# Patient Record
Sex: Female | Born: 1970 | Race: White | Hispanic: No | Smoking: Former smoker
Health system: Southern US, Community
[De-identification: ages and names within clinical notes are randomized; demographics above are authoritative.]

## PROBLEM LIST (undated history)

## (undated) DIAGNOSIS — F419 Anxiety disorder, unspecified: Secondary | ICD-10-CM

## (undated) DIAGNOSIS — N200 Calculus of kidney: Secondary | ICD-10-CM

## (undated) DIAGNOSIS — N19 Unspecified kidney failure: Secondary | ICD-10-CM

## (undated) HISTORY — PX: TUBAL LIGATION: SHX77

## (undated) HISTORY — DX: Anxiety disorder, unspecified: F41.9

## (undated) HISTORY — PX: KIDNEY SURGERY: SHX687

---
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
## 2003-04-30 DIAGNOSIS — N189 Chronic kidney disease, unspecified: Secondary | ICD-10-CM

## 2003-04-30 HISTORY — DX: Chronic kidney disease, unspecified: N18.9

## 2010-10-19 ENCOUNTER — Emergency Department (HOSPITAL_BASED_OUTPATIENT_CLINIC_OR_DEPARTMENT_OTHER)
Admission: EM | Admit: 2010-10-19 | Discharge: 2010-10-19 | Disposition: A | Discharge status: Home / Self Care | Payer: Self-pay | Attending: Emergency Medicine | Admitting: Emergency Medicine

## 2010-10-19 ENCOUNTER — Encounter: Payer: Self-pay | Admitting: *Deleted

## 2010-10-19 DIAGNOSIS — F17200 Nicotine dependence, unspecified, uncomplicated: Secondary | ICD-10-CM | POA: Insufficient documentation

## 2010-10-19 DIAGNOSIS — K047 Periapical abscess without sinus: Secondary | ICD-10-CM | POA: Insufficient documentation

## 2010-10-19 DIAGNOSIS — R220 Localized swelling, mass and lump, head: Secondary | ICD-10-CM | POA: Insufficient documentation

## 2010-10-19 HISTORY — DX: Unspecified kidney failure: N19

## 2010-10-19 HISTORY — DX: Calculus of kidney: N20.0

## 2010-10-19 MED ORDER — CLINDAMYCIN HCL 150 MG PO CAPS: 300.0000 mg | ORAL_CAPSULE | Freq: Three times a day (TID) | ORAL | Status: AC

## 2010-10-19 MED ORDER — IBUPROFEN 800 MG PO TABS: 800.0000 mg | ORAL_TABLET | Freq: Three times a day (TID) | ORAL | Status: AC

## 2010-10-19 NOTE — ED Provider Notes (Signed)
History     Chief Complaint  Patient presents with  . Oral Swelling   HPI  Pt c/o swelling and mild pain of right jaw x 2 days.  No injury.  Temp of 100.2 at home.  Has been taking ibuprofen and tylenol for pain w/ some relief.  Does not currently have a dentist.  Past Medical History  Diagnosis Date  . Kidney stones   . Kidney failure     Past Surgical History  Procedure Date  . Kidney surgery   . Tubal ligation     No family history on file.  History  Substance Use Topics  . Smoking status: Current Everyday Smoker -- 1.0 packs/day  . Smokeless tobacco: Not on file  . Alcohol Use: Yes    OB History    Grav Para Term Preterm Abortions TAB SAB Ect Mult Living                  Review of Systems  All other systems reviewed and are negative.    Physical Exam  BP 135/83  Pulse 76  Temp(Src) 100.3 F (37.9 C) (Oral)  Resp 20  Ht 5\' 6"  (1.676 m)  Wt 224 lb (101.606 kg)  BMI 36.15 kg/m2  SpO2 98%  LMP 10/05/2010  Physical Exam  Nursing note and vitals reviewed. Constitutional: She is oriented to person, place, and time. She appears well-developed and well-nourished.  HENT:  Head: Normocephalic and atraumatic. No trismus in the jaw.  Mouth/Throat: Uvula is midline and oropharynx is clear and moist.       Swelling at right mandible.  Poor dentition.  Decay and tenderness of right lower 2nd premolar.    Eyes:       Normal appearance  Neck: Normal range of motion. Neck supple.  Lymphadenopathy:    She has no cervical adenopathy.  Neurological: She is alert and oriented to person, place, and time.  Psychiatric: She has a normal mood and affect. Her behavior is normal.    ED Course  Procedures  MDM Pt presents w/ right jaw edema.  Right lower 2nd premolar decayed and ttp.  Likely has a periapical abscess.  Discharged home w/ abx, ibuprofen and referral to several low cost dental clinics.       Arie Sabina [place], [place] 10/19/10 2032

## 2010-10-19 NOTE — ED Notes (Signed)
Pt states she has had swelling and pain to the right lower jaw area onset yesterday.

## 2010-10-19 NOTE — ED Provider Notes (Signed)
Evaluation and management procedures were performed by the mid-level provider (PA/NP/CNM) under my supervision/collaboration.   Wilborn Membreno D Cagney Steenson, MD 10/19/10 [id number] 

## 2018-03-04 DIAGNOSIS — S62619D Displaced fracture of proximal phalanx of unspecified finger, subsequent encounter for fracture with routine healing: Secondary | ICD-10-CM | POA: Diagnosis not present

## 2018-04-01 DIAGNOSIS — S62619D Displaced fracture of proximal phalanx of unspecified finger, subsequent encounter for fracture with routine healing: Secondary | ICD-10-CM | POA: Diagnosis not present

## 2018-08-18 DIAGNOSIS — F329 Major depressive disorder, single episode, unspecified: Secondary | ICD-10-CM | POA: Diagnosis not present

## 2018-08-18 DIAGNOSIS — R4586 Emotional lability: Secondary | ICD-10-CM | POA: Diagnosis not present

## 2018-11-09 DIAGNOSIS — R102 Pelvic and perineal pain: Secondary | ICD-10-CM | POA: Diagnosis not present

## 2018-11-09 DIAGNOSIS — Z1239 Encounter for other screening for malignant neoplasm of breast: Secondary | ICD-10-CM | POA: Diagnosis not present

## 2018-11-09 DIAGNOSIS — L259 Unspecified contact dermatitis, unspecified cause: Secondary | ICD-10-CM | POA: Diagnosis not present

## 2018-11-09 DIAGNOSIS — Z01411 Encounter for gynecological examination (general) (routine) with abnormal findings: Secondary | ICD-10-CM | POA: Diagnosis not present

## 2018-11-09 DIAGNOSIS — Z3040 Encounter for surveillance of contraceptives, unspecified: Secondary | ICD-10-CM | POA: Diagnosis not present

## 2018-11-09 DIAGNOSIS — Z124 Encounter for screening for malignant neoplasm of cervix: Secondary | ICD-10-CM | POA: Diagnosis not present

## 2018-12-20 DIAGNOSIS — R102 Pelvic and perineal pain: Secondary | ICD-10-CM | POA: Diagnosis not present

## 2018-12-20 DIAGNOSIS — N920 Excessive and frequent menstruation with regular cycle: Secondary | ICD-10-CM | POA: Diagnosis not present

## 2018-12-20 DIAGNOSIS — R87619 Unspecified abnormal cytological findings in specimens from cervix uteri: Secondary | ICD-10-CM | POA: Diagnosis not present

## 2019-05-30 DIAGNOSIS — F419 Anxiety disorder, unspecified: Secondary | ICD-10-CM | POA: Diagnosis not present

## 2019-05-30 DIAGNOSIS — E559 Vitamin D deficiency, unspecified: Secondary | ICD-10-CM | POA: Diagnosis not present

## 2019-05-30 DIAGNOSIS — R4586 Emotional lability: Secondary | ICD-10-CM | POA: Diagnosis not present

## 2019-05-30 DIAGNOSIS — F329 Major depressive disorder, single episode, unspecified: Secondary | ICD-10-CM | POA: Diagnosis not present

## 2019-06-06 ENCOUNTER — Other Ambulatory Visit: Payer: Self-pay

## 2019-06-07 ENCOUNTER — Encounter: Payer: Self-pay | Admitting: Family Medicine

## 2019-06-07 ENCOUNTER — Ambulatory Visit (INDEPENDENT_AMBULATORY_CARE_PROVIDER_SITE_OTHER): Payer: BC Managed Care – PPO | Admitting: Family Medicine

## 2019-06-07 VITALS — BP 122/74 | HR 66 | Temp 97.4°F | Ht 66.0 in | Wt 247.2 lb

## 2019-06-07 DIAGNOSIS — Z23 Encounter for immunization: Secondary | ICD-10-CM | POA: Insufficient documentation

## 2019-06-07 DIAGNOSIS — F418 Other specified anxiety disorders: Secondary | ICD-10-CM | POA: Insufficient documentation

## 2019-06-07 DIAGNOSIS — E669 Obesity, unspecified: Secondary | ICD-10-CM | POA: Insufficient documentation

## 2019-06-07 DIAGNOSIS — E559 Vitamin D deficiency, unspecified: Secondary | ICD-10-CM | POA: Insufficient documentation

## 2019-06-07 NOTE — Progress Notes (Signed)
New Patient Office Visit  Subjective:  Patient ID: Tracey Crane, female    DOB: 03-Feb-1971  Age: 49 y.o. MRN: [id number]  CC:  Chief Complaint  Patient presents with  . Establish Care    New patient, no concerns.     HPI Danah Reinecke presents for ongoing care for her anxiety with depression by way of transfer from her physician in [place].  She is moved into the [place] area.  She works in [employer] at a [employer] with another patient of mine.  He referred her here to me.  Ongoing longstanding history of anxiety and depression treated with Lexapro.  By enlarge she has responded well to this medication.  Ongoing struggle with obesity.  She has tried weight watchers in the past with some success.  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  She quit smoking 2 years ago.  She drinks 1 glass of wine weekly.  She is not getting any regular exercise other than at work.  She complains of chronic joint aches and pains.  Mom passed a few years ago from a rare form of cervical cancer.  Dad's health history is unknown.  She is seeing GYN for her female care.  Menses are becoming further heart and she believes that she may be entering menopause.  Recent lab work performed by her previous physician this past week show essentially normal CMP, CBC, TSH of vitamin D level was 20.  She is taking 5000 international units of vitamin D daily. Past Medical History:  Diagnosis Date  . Anxiety   . Chronic kidney disease 04/30/2003   kidney stones blocked right kidney     Past Surgical History:  Procedure Laterality Date  . TUBAL LIGATION      Family History  Problem Relation Age of Onset  . Diabetes Mother   . Cancer Mother   . Diabetes Maternal Grandmother   . Alzheimer's disease Maternal Grandmother     Social History   Socioeconomic History  . Marital status: Single    Spouse name: Not on file  . Number of children: Not on file  . Years of education: Not on  file  . Highest education level: Not on file  Occupational History  . Not on file  Tobacco Use  . Smoking status: Former Smoker    Types: Cigarettes    Quit date: 04/10/2017    Years since quitting: 2.1  . Smokeless tobacco: Former Network engineer and Sexual Activity  . Alcohol use: Yes    Alcohol/week: 1.0 standard drinks    Types: 1 Glasses of wine per week  . Drug use: Never  . Sexual activity: Yes  Other Topics Concern  . Not on file  Social History Narrative  . Not on file   Social Determinants of Health   Financial Resource Strain:   . Difficulty of Paying Living Expenses: Not on file  Food Insecurity:   . Worried About Charity fundraiser in the Last Year: Not on file  . Ran Out of Food in the Last Year: Not on file  Transportation Needs:   . Lack of Transportation (Medical): Not on file  . Lack of Transportation (Non-Medical): Not on file  Physical Activity:   . Days of Exercise per Week: Not on file  . Minutes of Exercise per Session: Not on file  Stress:   . Feeling of Stress : Not on file  Social Connections:   . Frequency  of Communication with Friends and Family: Not on file  . Frequency of Social Gatherings with Friends and Family: Not on file  . Attends Religious Services: Not on file  . Active Member of Clubs or Organizations: Not on file  . Attends Banker Meetings: Not on file  . Marital Status: Not on file  Intimate Partner Violence:   . Fear of Current or Ex-Partner: Not on file  . Emotionally Abused: Not on file  . Physically Abused: Not on file  . Sexually Abused: Not on file    ROS Review of Systems  Constitutional: Negative.   HENT: Negative.   Respiratory: Negative.   Cardiovascular: Negative.   Gastrointestinal: Negative.   Genitourinary: Negative.   Musculoskeletal: Positive for arthralgias and myalgias.  Skin: Negative for pallor and rash.  Allergic/Immunologic: Negative for immunocompromised state.  Neurological:  Negative for light-headedness and numbness.  Hematological: Does not bruise/bleed easily.  Psychiatric/Behavioral: Negative.    Depression screen [facility] 2/9 06/07/2019  Decreased Interest 0  Down, Depressed, Hopeless 0  PHQ - 2 Score 0  Altered sleeping 1  Tired, decreased energy 2  Change in appetite 2  Feeling bad or failure about yourself  0  Trouble concentrating 0  Moving slowly or fidgety/restless 0  Suicidal thoughts 0  PHQ-9 Score 5  Difficult doing work/chores Somewhat difficult    Objective:   Today's Vitals: BP 122/74   Pulse 66   Temp (!) 97.4 F (36.3 C) (Tympanic)   Ht 5\' 6"  (1.676 m)   Wt 247 lb 3.2 oz (112.1 kg)   SpO2 97%   BMI 39.90 kg/m   Physical Exam Constitutional:      General: She is not in acute distress.    Appearance: She is obese. She is not ill-appearing, toxic-appearing or diaphoretic.  HENT:     Head: Normocephalic and atraumatic.     Right Ear: Tympanic membrane, ear canal and external ear normal. There is no impacted cerumen.     Left Ear: Tympanic membrane, ear canal and external ear normal. There is no impacted cerumen.  Eyes:     General: No scleral icterus.       Right eye: No discharge.        Left eye: No discharge.     Extraocular Movements: Extraocular movements intact.     Conjunctiva/sclera: Conjunctivae normal.     Pupils: Pupils are equal, round, and reactive to light.  Cardiovascular:     Rate and Rhythm: Normal rate and regular rhythm.  Pulmonary:     Effort: Pulmonary effort is normal.     Breath sounds: Normal breath sounds.  Musculoskeletal:     Cervical back: No rigidity or tenderness.     Right lower leg: No edema.     Left lower leg: No edema.       Legs:  Lymphadenopathy:     Cervical: No cervical adenopathy.  Skin:    General: Skin is warm and dry.  Neurological:     Mental Status: She is alert and oriented to person, place, and time.  Psychiatric:        Mood and Affect: Mood normal.        Behavior:  Behavior normal.     Assessment & Plan:   Problem List Items Addressed This Visit      Other   Obesity (BMI 35.0-39.9 without comorbidity)   Relevant Orders   Amb Ref to Medical Weight Management   Need for Tdap vaccination -  Primary   Relevant Orders   Tdap vaccine greater than or equal to 7yo IM (Completed)   Anxiety with depression   Relevant Medications   escitalopram (LEXAPRO) 20 MG tablet   Vitamin D deficiency      Outpatient Encounter Medications as of 06/07/2019  Medication Sig  . escitalopram (LEXAPRO) 20 MG tablet Take 20 mg by mouth daily.   No facility-administered encounter medications on file as of 06/07/2019.    Follow-up: Return in about 6 months (around 12/08/2019).   Patient agrees to give weight loss management of tried.  She will continue her Lexapro and vitamin D supplementation.  Suggested standing Tylenol every 8 for her body aches and pains.  She is aware of the relationship between her weight and joint pains.  Suggested inserts for her shoes.  She is wearing a type of shoes that have inserts available as needed.  Mliss Sax, MD

## 2019-06-07 NOTE — Patient Instructions (Addendum)
Mindfulness-Based Stress Reduction Mindfulness-based stress reduction (MBSR) is a program that helps people learn to practice mindfulness. Mindfulness is the practice of intentionally paying attention to the present moment. It can be learned and practiced through techniques such as education, breathing exercises, meditation, and yoga. MBSR includes several mindfulness techniques in one program. MBSR works best when you understand the treatment, are willing to try new things, and can commit to spending time practicing what you learn. MBSR training may include learning about:  How your emotions, thoughts, and reactions affect your body.  New ways to respond to things that cause negative thoughts to start (triggers).  How to notice your thoughts and let go of them.  Practicing awareness of everyday things that you normally do without thinking.  The techniques and goals of different types of meditation. What are the benefits of MBSR? MBSR can have many benefits, which include helping you to:  Develop self-awareness. This refers to knowing and understanding yourself.  Learn skills and attitudes that help you to participate in your own health care.  Learn new ways to care for yourself.  Be more accepting about how things are, and let things go.  Be less judgmental and approach things with an open mind.  Be patient with yourself and trust yourself more. MBSR has also been shown to:  Reduce negative emotions, such as depression and anxiety.  Improve memory and focus.  Change how you sense and approach pain.  Boost your body's ability to fight infections.  Help you connect better with other people.  Improve your sense of well-being. Follow these instructions at home:   Find a local in-person or online MBSR program.  Set aside some time regularly for mindfulness practice.  Find a mindfulness practice that works best for you. This may include one or more of the  following: ? Meditation. Meditation involves focusing your mind on a certain thought or activity. ? Breathing awareness exercises. These help you to stay present by focusing on your breath. ? Body scan. For this practice, you lie down and pay attention to each part of your body from head to toe. You can identify tension and soreness and intentionally relax parts of your body. ? Yoga. Yoga involves stretching and breathing, and it can improve your ability to move and be flexible. It can also provide an experience of testing your body's limits, which can help you release stress. ? Mindful eating. This way of eating involves focusing on the taste, texture, color, and smell of each bite of food. Because this slows down eating and helps you feel full sooner, it can be an important part of a weight-loss plan.  Find a podcast or recording that provides guidance for breathing awareness, body scan, or meditation exercises. You can listen to these any time when you have a free moment to rest without distractions.  Follow your treatment plan as told by your health care provider. This may include taking regular medicines and making changes to your diet or lifestyle as recommended. How to practice mindfulness To do a basic awareness exercise:  Find a comfortable place to sit.  Pay attention to the present moment. Observe your thoughts, feelings, and surroundings just as they are.  Avoid placing judgment on yourself, your feelings, or your surroundings. Make note of any judgment that comes up, and let it go.  Your mind may wander, and that is okay. Make note of when your thoughts drift, and return your attention to the present moment. To do   basic mindfulness meditation:  Find a comfortable place to sit. This may include a stable chair or a firm floor cushion. ? Sit upright with your back straight. Let your arms fall next to your side with your hands resting on your legs. ? If sitting in a chair, rest your feet flat on  the floor. ? If sitting on a cushion, cross your legs in front of you.  Keep your head in a neutral position with your chin dropped slightly. Relax your jaw and rest the tip of your tongue on the roof of your mouth. Drop your gaze to the floor. You can close your eyes if you like.  Breathe normally and pay attention to your breath. Feel the air moving in and out of your nose. Feel your belly expanding and relaxing with each breath.  Your mind may wander, and that is okay. Make note of when your thoughts drift, and return your attention to your breath.  Avoid placing judgment on yourself, your feelings, or your surroundings. Make note of any judgment or feelings that come up, let them go, and bring your attention back to your breath.  When you are ready, lift your gaze or open your eyes. Pay attention to how your body feels after the meditation. Where to find more information You can find more information about MBSR from:  Your health care provider.  Community-based meditation centers or programs.  Programs offered near you. Summary  Mindfulness-based stress reduction (MBSR) is a program that teaches you how to intentionally pay attention to the present moment. It is used with other treatments to help you cope better with daily stress, emotions, and pain.  MBSR focuses on developing self-awareness, which allows you to respond to life stress without judgment or negative emotions.  MBSR programs may involve learning different mindfulness practices, such as breathing exercises, meditation, yoga, body scan, or mindful eating. Find a mindfulness practice that works best for you, and set aside time for it on a regular basis. This information is not intended to replace advice given to you by your health care provider. Make sure you discuss any questions you have with your health care provider. Document Revised: 02/27/2017 Document Reviewed: 07/24/2016 Elsevier Patient Education  2020 Elsevier  Inc.  Exercising to Stay Healthy To become healthy and stay healthy, it is recommended that you do moderate-intensity and vigorous-intensity exercise. You can tell that you are exercising at a moderate intensity if your heart starts beating faster and you start breathing faster but can still hold a conversation. You can tell that you are exercising at a vigorous intensity if you are breathing much harder and faster and cannot hold a conversation while exercising. Exercising regularly is important. It has many health benefits, such as:  Improving overall fitness, flexibility, and endurance.  Increasing bone density.  Helping with weight control.  Decreasing body fat.  Increasing muscle strength.  Reducing stress and tension.  Improving overall health. How often should I exercise? Choose an activity that you enjoy, and set realistic goals. Your health care provider can help you make an activity plan that works for you. Exercise regularly as told by your health care provider. This may include:  Doing strength training two times a week, such as: ? Lifting weights. ? Using resistance bands. ? Push-ups. ? Sit-ups. ? Yoga.  Doing a certain intensity of exercise for a given amount of time. Choose from these options: ? A total of 150 minutes of moderate-intensity exercise every week. ? A   total of 75 minutes of vigorous-intensity exercise every week. ? A mix of moderate-intensity and vigorous-intensity exercise every week. Children, pregnant women, people who have not exercised regularly, people who are overweight, and older adults may need to talk with a health care provider about what activities are safe to do. If you have a medical condition, be sure to talk with your health care provider before you start a new exercise program. What are some exercise ideas? Moderate-intensity exercise ideas include:  Walking 1 mile (1.6 km) in about 15  minutes.  Biking.  Hiking.  Golfing.  Dancing.  Water aerobics. Vigorous-intensity exercise ideas include:  Walking 4.5 miles (7.2 km) or more in about 1 hour.  Jogging or running 5 miles (8 km) in about 1 hour.  Biking 10 miles (16.1 km) or more in about 1 hour.  Lap swimming.  Roller-skating or in-line skating.  Cross-country skiing.  Vigorous competitive sports, such as football, basketball, and soccer.  Jumping rope.  Aerobic dancing. What are some everyday activities that can help me to get exercise?  Yard work, such as: ? Pushing a Surveyor, mining. ? Raking and bagging leaves.  Washing your car.  Pushing a stroller.  Shoveling snow.  Gardening.  Washing windows or floors. How can I be more active in my day-to-day activities?  Use stairs instead of an elevator.  Take a walk during your lunch break.  If you drive, park your car farther away from your work or school.  If you take public transportation, get off one stop early and walk the rest of the way.  Stand up or walk around during all of your indoor phone calls.  Get up, stretch, and walk around every 30 minutes throughout the day.  Enjoy exercise with a friend. Support to continue exercising will help you keep a regular routine of activity. What guidelines can I follow while exercising?  Before you start a new exercise program, talk with your health care provider.  Do not exercise so much that you hurt yourself, feel dizzy, or get very short of breath.  Wear comfortable clothes and wear shoes with good support.  Drink plenty of water while you exercise to prevent dehydration or heat stroke.  Work out until your breathing and your heartbeat get faster. Where to find more information  [place]. Department of Health and Human Services: ThisPath.fi  Centers for Disease Control and Prevention (CDC): FootballExhibition.com.br Summary  Exercising regularly is important. It will improve your overall fitness,  flexibility, and endurance.  Regular exercise also will improve your overall health. It can help you control your weight, reduce stress, and improve your bone density.  Do not exercise so much that you hurt yourself, feel dizzy, or get very short of breath.  Before you start a new exercise program, talk with your health care provider. This information is not intended to replace advice given to you by your health care provider. Make sure you discuss any questions you have with your health care provider. Document Revised: 02/27/2017 Document Reviewed: 02/05/2017 Elsevier Patient Education  2020 [employer].

## 2019-12-08 ENCOUNTER — Ambulatory Visit: Payer: BC Managed Care – PPO | Admitting: Family Medicine

## 2019-12-21 ENCOUNTER — Other Ambulatory Visit: Payer: Self-pay

## 2019-12-22 ENCOUNTER — Ambulatory Visit: Payer: BC Managed Care – PPO | Admitting: Family Medicine

## 2019-12-22 ENCOUNTER — Encounter: Payer: Self-pay | Admitting: Family Medicine

## 2019-12-22 VITALS — BP 118/68 | HR 75 | Ht 66.0 in | Wt 233.4 lb

## 2019-12-22 DIAGNOSIS — L259 Unspecified contact dermatitis, unspecified cause: Secondary | ICD-10-CM | POA: Diagnosis not present

## 2019-12-22 DIAGNOSIS — Z01419 Encounter for gynecological examination (general) (routine) without abnormal findings: Secondary | ICD-10-CM | POA: Diagnosis not present

## 2019-12-22 DIAGNOSIS — E559 Vitamin D deficiency, unspecified: Secondary | ICD-10-CM

## 2019-12-22 DIAGNOSIS — Z0000 Encounter for general adult medical examination without abnormal findings: Secondary | ICD-10-CM | POA: Insufficient documentation

## 2019-12-22 DIAGNOSIS — I8391 Asymptomatic varicose veins of right lower extremity: Secondary | ICD-10-CM | POA: Diagnosis not present

## 2019-12-22 DIAGNOSIS — F418 Other specified anxiety disorders: Secondary | ICD-10-CM | POA: Diagnosis not present

## 2019-12-22 DIAGNOSIS — R21 Rash and other nonspecific skin eruption: Secondary | ICD-10-CM

## 2019-12-22 DIAGNOSIS — Z3040 Encounter for surveillance of contraceptives, unspecified: Secondary | ICD-10-CM | POA: Diagnosis not present

## 2019-12-22 DIAGNOSIS — Z1231 Encounter for screening mammogram for malignant neoplasm of breast: Secondary | ICD-10-CM | POA: Diagnosis not present

## 2019-12-22 NOTE — Progress Notes (Signed)
New Patient Office Visit  Subjective:  Patient ID: Tracey Crane, female    DOB: [birth year]  Age: 49 y.o. MRN: [id number]  CC:  Chief Complaint  Patient presents with  . Follow-up    6 month follow up, concerns about rash on both arms would like varicose veins checked.     HPI Tracey Crane presents for follow-up of anxiety and depression and vitamin D deficiency.  Continues to take Lexapro 20 mg with good effect for her anxiety and depression.  Is helping her good deal.  She is also taking Estroven through her GYN provider and this is helped a good deal as well.  Continues to take over-the-counter vitamin D.  She has some prominent veins behind her right knee she would like for me to look at.  She has an asymptomatic stable rash in her posterior upper arm area.  There is no scaling bleeding or cracking.  Denies new skin contacts.  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  Past Medical History:  Diagnosis Date  . Anxiety   . Chronic kidney disease 04/30/2003   kidney stones blocked right kidney     Past Surgical History:  Procedure Laterality Date  . TUBAL LIGATION      Family History  Problem Relation Age of Onset  . Diabetes Mother   . Cancer Mother   . Diabetes Maternal Grandmother   . Alzheimer's disease Maternal Grandmother     Social History   Socioeconomic History  . Marital status: Single    Spouse name: Not on file  . Number of children: Not on file  . Years of education: Not on file  . Highest education level: Not on file  Occupational History  . Not on file  Tobacco Use  . Smoking status: Former Smoker    Types: Cigarettes    Quit date: 04/10/2017    Years since quitting: 2.7  . Smokeless tobacco: Former Clinical biochemist  . Vaping Use: Some days  Substance and Sexual Activity  . Alcohol use: Yes    Alcohol/week: 1.0 standard drink    Types: 1 Glasses of wine per week  . Drug use: Never  . Sexual  activity: Yes  Other Topics Concern  . Not on file  Social History Narrative  . Not on file   Social Determinants of Health   Financial Resource Strain:   . Difficulty of Paying Living Expenses: Not on file  Food Insecurity:   . Worried About Programme researcher, broadcasting/film/video in the Last Year: Not on file  . Ran Out of Food in the Last Year: Not on file  Transportation Needs:   . Lack of Transportation (Medical): Not on file  . Lack of Transportation (Non-Medical): Not on file  Physical Activity:   . Days of Exercise per Week: Not on file  . Minutes of Exercise per Session: Not on file  Stress:   . Feeling of Stress : Not on file  Social Connections:   . Frequency of Communication with Friends and Family: Not on file  . Frequency of Social Gatherings with Friends and Family: Not on file  . Attends Religious Services: Not on file  . Active Member of Clubs or Organizations: Not on file  . Attends Banker Meetings: Not on file  . Marital Status: Not on file  Intimate Partner Violence:   . Fear of Current or Ex-Partner: Not on file  .  Emotionally Abused: Not on file  . Physically Abused: Not on file  . Sexually Abused: Not on file    ROS Review of Systems  Constitutional: Negative.   HENT: Negative.   Eyes: Negative for photophobia and visual disturbance.  Respiratory: Negative.   Cardiovascular: Negative.   Gastrointestinal: Negative.   Endocrine: Negative for polyphagia and polyuria.  Genitourinary: Negative.   Musculoskeletal: Negative for gait problem and joint swelling.  Skin: Positive for rash.  Allergic/Immunologic: Negative for immunocompromised state.  Neurological: Negative.   Hematological: Does not bruise/bleed easily.   Depression screen [facility] 2/9 12/22/2019 06/07/2019  Decreased Interest 0 0  Down, Depressed, Hopeless 0 0  PHQ - 2 Score 0 0  Altered sleeping 1 1  Tired, decreased energy 1 2  Change in appetite 2 2  Feeling bad or failure about yourself  0  0  Trouble concentrating 0 0  Moving slowly or fidgety/restless 2 0  Suicidal thoughts 0 0  PHQ-9 Score 6 5  Difficult doing work/chores Somewhat difficult Somewhat difficult    Objective:   Today's Vitals: BP 118/68   Pulse 75   Ht 5\' 6"  (1.676 m)   Wt 233 lb 6.4 oz (105.9 kg)   SpO2 95%   BMI 37.67 kg/m   Physical Exam Vitals and nursing note reviewed.  Constitutional:      General: She is not in acute distress.    Appearance: Normal appearance. She is not ill-appearing, toxic-appearing or diaphoretic.  HENT:     Head: Normocephalic and atraumatic.     Right Ear: External ear normal.     Left Ear: External ear normal.  Eyes:     General: No scleral icterus.       Right eye: No discharge.        Left eye: No discharge.     Extraocular Movements: Extraocular movements intact.     Conjunctiva/sclera: Conjunctivae normal.     Pupils: Pupils are equal, round, and reactive to light.  Cardiovascular:     Rate and Rhythm: Normal rate and regular rhythm.  Pulmonary:     Effort: Pulmonary effort is normal.     Breath sounds: Normal breath sounds.  Abdominal:     General: Bowel sounds are normal.  Musculoskeletal:     Cervical back: No rigidity or tenderness.     Right lower leg: No edema.     Left lower leg: No edema.       Legs:  Lymphadenopathy:     Cervical: No cervical adenopathy.  Skin:    General: Skin is warm and dry.       Neurological:     Mental Status: She is alert and oriented to person, place, and time.  Psychiatric:        Mood and Affect: Mood normal.        Behavior: Behavior normal.     Assessment & Plan:   Problem List Items Addressed This Visit      Cardiovascular and Mediastinum   Asymptomatic varicose veins of right lower extremity     Musculoskeletal and Integument   Rash     Other   Anxiety with depression   Vitamin D deficiency - Primary   Relevant Orders   VITAMIN D 25 Hydroxy (Vit-D Deficiency, Fractures)   Healthcare  maintenance   Relevant Orders   CBC   Comprehensive metabolic panel   Lipid panel   Urinalysis, Routine w reflex microscopic      Outpatient Encounter Medications  as of 12/22/2019  Medication Sig  . escitalopram (LEXAPRO) 20 MG tablet Take 20 mg by mouth daily.   No facility-administered encounter medications on file as of 12/22/2019.    Follow-up: Return in about 6 months (around 06/20/2020), or if symptoms worsen or fail to improve, for return fasting for blood work. Mliss Sax, MD

## 2019-12-22 NOTE — Patient Instructions (Signed)
Varicose Veins Varicose veins are veins that have become enlarged, bulged, and twisted. They most often appear in the legs. What are the causes? This condition is caused by damage to the valves in the vein. These valves help blood return to your heart. When they are damaged and they stop working properly, blood may flow backward and back up in the veins near the skin, causing the veins to get larger and appear twisted. The condition can result from any issue that causes blood to back up, like pregnancy, prolonged standing, or obesity. What increases the risk? This condition is more likely to develop in people who are:  On their feet a lot.  Pregnant.  Overweight. What are the signs or symptoms? Symptoms of this condition include:  Bulging, twisted, and bluish veins.  A feeling of heaviness. This may be worse at the end of the day.  Leg pain. This may be worse at the end of the day.  Swelling in the leg.  Changes in skin color over the veins. How is this diagnosed? This condition may be diagnosed based on your symptoms, a physical exam, and an ultrasound test. How is this treated? Treatment for this condition may involve:  Avoiding sitting or standing in one position for long periods of time.  Wearing compression stockings. These stockings help to prevent blood clots and reduce swelling in the legs.  Raising (elevating) the legs when resting.  Losing weight.  Exercising regularly. If you have persistent symptoms or want to improve the way your varicose veins look, you may choose to have a procedure to close the varicose veins off or to remove them. Treatments to close off the veins include:  Sclerotherapy. In this treatment, a solution is injected into a vein to close it off.  Laser treatment. In this treatment, the vein is heated with a laser to close it off.  Radiofrequency vein ablation. In this treatment, an electrical current produced by radio waves is used to close  off the vein. Treatments to remove the veins include:  Phlebectomy. In this treatment, the veins are removed through small incisions made over the veins.  Vein ligation and stripping. In this treatment, incisions are made over the veins. The veins are then removed after being tied (ligated) with stitches (sutures). Follow these instructions at home: Activity  Walk as much as possible. Walking increases blood flow. This helps blood return to the heart and takes pressure off your veins. It also increases your cardiovascular strength.  Follow your health care provider's instructions about exercising.  Do not stand or sit in one position for a long period of time.  Do not sit with your legs crossed.  Rest with your legs raised during the day. General instructions   Follow any diet instructions given to you by your health care provider.  Wear compression stockings as directed by your health care provider. Do not wear other kinds of tight clothing around your legs, pelvis, or waist.  Elevate your legs at night to above the level of your heart.  If you get a cut in the skin over the varicose vein and the vein bleeds: ? Lie down with your leg raised. ? Apply firm pressure to the cut with a clean cloth until the bleeding stops. ? Place a bandage (dressing) on the cut. Contact a health care provider if:  The skin around your varicose veins starts to break down.  You have pain, redness, tenderness, or hard swelling over a vein.  You  are uncomfortable because of pain.  You get a cut in the skin over a varicose vein and it will not stop bleeding. Summary  Varicose veins are veins that have become enlarged, bulged, and twisted. They most often appear in the legs.  This condition is caused by damage to the valves in the vein. These valves help blood return to your heart.  Treatment for this condition includes frequent movements, wearing compression stockings, losing weight, and  exercising regularly. In some cases, procedures are done to close off or remove the veins.  Treatment for this condition may include wearing compression stockings, elevating the legs, losing weight, and engaging in regular activity. In some cases, procedures are done to close off or remove the veins. This information is not intended to replace advice given to you by your health care provider. Make sure you discuss any questions you have with your health care provider. Document Revised: 05/13/2018 Document Reviewed: 04/09/2016 Elsevier Patient Education  2020 [place] Maintenance, Female Adopting a healthy lifestyle and getting preventive care are important in promoting health and wellness. Ask your health care provider about:  The right schedule for you to have regular tests and exams.  Things you can do on your own to prevent diseases and keep yourself healthy. What should I know about diet, weight, and exercise? Eat a healthy diet   Eat a diet that includes plenty of vegetables, fruits, low-fat dairy products, and lean protein.  Do not eat a lot of foods that are high in solid fats, added sugars, or sodium. Maintain a healthy weight Body mass index (BMI) is used to identify weight problems. It estimates body fat based on height and weight. Your health care provider can help determine your BMI and help you achieve or maintain a healthy weight. Get regular exercise Get regular exercise. This is one of the most important things you can do for your health. Most adults should:  Exercise for at least 150 minutes each week. The exercise should increase your heart rate and make you sweat (moderate-intensity exercise).  Do strengthening exercises at least twice a week. This is in addition to the moderate-intensity exercise.  Spend less time sitting. Even light physical activity can be beneficial. Watch cholesterol and blood lipids Have your blood tested for lipids and  cholesterol at 47 years of age, then have this test every 5 years. Have your cholesterol levels checked more often if:  Your lipid or cholesterol levels are high.  You are older than 47 years of age.  You are at high risk for heart disease. What should I know about cancer screening? Depending on your health history and family history, you may need to have cancer screening at various ages. This may include screening for:  Breast cancer.  Cervical cancer.  Colorectal cancer.  Skin cancer.  Lung cancer. What should I know about heart disease, diabetes, and high blood pressure? Blood pressure and heart disease  High blood pressure causes heart disease and increases the risk of stroke. This is more likely to develop in people who have high blood pressure readings, are of African descent, or are overweight.  Have your blood pressure checked: ? Every 3-5 years if you are 24-15 years of age. ? Every year if you are 64 years old or older. Diabetes Have regular diabetes screenings. This checks your fasting blood sugar level. Have the screening done:  Once every three years after age 76 if you are at a normal weight and  have a low risk for diabetes.  More often and at a younger age if you are overweight or have a high risk for diabetes. What should I know about preventing infection? Hepatitis B If you have a higher risk for hepatitis B, you should be screened for this virus. Talk with your health care provider to find out if you are at risk for hepatitis B infection. Hepatitis C Testing is recommended for:  Everyone born from [birth year] through 1963.  Anyone with known risk factors for hepatitis C. Sexually transmitted infections (STIs)  Get screened for STIs, including gonorrhea and chlamydia, if: ? You are sexually active and are younger than 47 years of age. ? You are older than 47 years of age and your health care provider tells you that you are at risk for this type of  infection. ? Your sexual activity has changed since you were last screened, and you are at increased risk for chlamydia or gonorrhea. Ask your health care provider if you are at risk.  Ask your health care provider about whether you are at high risk for HIV. Your health care provider may recommend a prescription medicine to help prevent HIV infection. If you choose to take medicine to prevent HIV, you should first get tested for HIV. You should then be tested every 3 months for as long as you are taking the medicine. Pregnancy  If you are about to stop having your period (premenopausal) and you may become pregnant, seek counseling before you get pregnant.  Take 400 to 800 micrograms (mcg) of folic acid every day if you become pregnant.  Ask for birth control (contraception) if you want to prevent pregnancy. Osteoporosis and menopause Osteoporosis is a disease in which the bones lose minerals and strength with aging. This can result in bone fractures. If you are 58 years old or older, or if you are at risk for osteoporosis and fractures, ask your health care provider if you should:  Be screened for bone loss.  Take a calcium or vitamin D supplement to lower your risk of fractures.  Be given hormone replacement therapy (HRT) to treat symptoms of menopause. Follow these instructions at home: Lifestyle  Do not use any products that contain nicotine or tobacco, such as cigarettes, e-cigarettes, and chewing tobacco. If you need help quitting, ask your health care provider.  Do not use street drugs.  Do not share needles.  Ask your health care provider for help if you need support or information about quitting drugs. Alcohol use  Do not drink alcohol if: ? Your health care provider tells you not to drink. ? You are pregnant, may be pregnant, or are planning to become pregnant.  If you drink alcohol: ? Limit how much you use to 0-1 drink a day. ? Limit intake if you are  breastfeeding.  Be aware of how much alcohol is in your drink. In the [place] one drink equals one 12 oz bottle of beer (355 mL), one 5 oz glass of wine (148 mL), or one 1 oz glass of hard liquor (44 mL). General instructions  Schedule regular health, dental, and eye exams.  Stay current with your vaccines.  Tell your health care provider if: ? You often feel depressed. ? You have ever been abused or do not feel safe at home. Summary  Adopting a healthy lifestyle and getting preventive care are important in promoting health and wellness.  Follow your health care provider's instructions about healthy diet, exercising, and getting  tested or screened for diseases.  Follow your health care provider's instructions on monitoring your cholesterol and blood pressure. This information is not intended to replace advice given to you by your health care provider. Make sure you discuss any questions you have with your health care provider. Document Revised: 03/10/2018 Document Reviewed: 03/10/2018 Elsevier Patient Education  2020 [employer] Inc[employer]  Preventive Care 11-87 Years Old, Female Preventive care refers to visits with your health care provider and lifestyle choices that can promote health and wellness. This includes:  A yearly physical exam. This may also be called an annual well check.  Regular dental visits and eye exams.  Immunizations.  Screening for certain conditions.  Healthy lifestyle choices, such as eating a healthy diet, getting regular exercise, not using drugs or products that contain nicotine and tobacco, and limiting alcohol use. What can I expect for my preventive care visit? Physical exam Your health care provider will check your:  Height and weight. This may be used to calculate body mass index (BMI), which tells if you are at a healthy weight.  Heart rate and blood pressure.  Skin for abnormal spots. Counseling Your health care provider may ask you questions  about your:  Alcohol, tobacco, and drug use.  Emotional well-being.  Home and relationship well-being.  Sexual activity.  Eating habits.  Work and work Statistician.  Method of birth control.  Menstrual cycle.  Pregnancy history. What immunizations do I need?  Influenza (flu) vaccine  This is recommended every year. Tetanus, diphtheria, and pertussis (Tdap) vaccine  You may need a Td booster every 10 years. Varicella (chickenpox) vaccine  You may need this if you have not been vaccinated. Zoster (shingles) vaccine  You may need this after age 85. Measles, mumps, and rubella (MMR) vaccine  You may need at least one dose of MMR if you were born in 1957 or later. You may also need a second dose. Pneumococcal conjugate (PCV13) vaccine  You may need this if you have certain conditions and were not previously vaccinated. Pneumococcal polysaccharide (PPSV23) vaccine  You may need one or two doses if you smoke cigarettes or if you have certain conditions. Meningococcal conjugate (MenACWY) vaccine  You may need this if you have certain conditions. Hepatitis A vaccine  You may need this if you have certain conditions or if you travel or work in places where you may be exposed to hepatitis A. Hepatitis B vaccine  You may need this if you have certain conditions or if you travel or work in places where you may be exposed to hepatitis B. Haemophilus influenzae type b (Hib) vaccine  You may need this if you have certain conditions. Human papillomavirus (HPV) vaccine  If recommended by your health care provider, you may need three doses over 6 months. You may receive vaccines as individual doses or as more than one vaccine together in one shot (combination vaccines). Talk with your health care provider about the risks and benefits of combination vaccines. What tests do I need? Blood tests  Lipid and cholesterol levels. These may be checked every 5 years, or more  frequently if you are over 31 years old.  Hepatitis C test.  Hepatitis B test. Screening  Lung cancer screening. You may have this screening every year starting at age 60 if you have a 30-pack-year history of smoking and currently smoke or have quit within the past 15 years.  Colorectal cancer screening. All adults should have this screening starting at age 62  and continuing until age 15. Your health care provider may recommend screening at age 37 if you are at increased risk. You will have tests every 1-10 years, depending on your results and the type of screening test.  Diabetes screening. This is done by checking your blood sugar (glucose) after you have not eaten for a while (fasting). You may have this done every 1-3 years.  Mammogram. This may be done every 1-2 years. Talk with your health care provider about when you should start having regular mammograms. This may depend on whether you have a family history of breast cancer.  BRCA-related cancer screening. This may be done if you have a family history of breast, ovarian, tubal, or peritoneal cancers.  Pelvic exam and Pap test. This may be done every 3 years starting at age 7. Starting at age 23, this may be done every 5 years if you have a Pap test in combination with an HPV test. Other tests  Sexually transmitted disease (STD) testing.  Bone density scan. This is done to screen for osteoporosis. You may have this scan if you are at high risk for osteoporosis. Follow these instructions at home: Eating and drinking  Eat a diet that includes fresh fruits and vegetables, whole grains, lean protein, and low-fat dairy.  Take vitamin and mineral supplements as recommended by your health care provider.  Do not drink alcohol if: ? Your health care provider tells you not to drink. ? You are pregnant, may be pregnant, or are planning to become pregnant.  If you drink alcohol: ? Limit how much you have to 0-1 drink a day. ? Be aware  of how much alcohol is in your drink. In the [place] one drink equals one 12 oz bottle of beer (355 mL), one 5 oz glass of wine (148 mL), or one 1 oz glass of hard liquor (44 mL). Lifestyle  Take daily care of your teeth and gums.  Stay active. Exercise for at least 30 minutes on 5 or more days each week.  Do not use any products that contain nicotine or tobacco, such as cigarettes, e-cigarettes, and chewing tobacco. If you need help quitting, ask your health care provider.  If you are sexually active, practice safe sex. Use a condom or other form of birth control (contraception) in order to prevent pregnancy and STIs (sexually transmitted infections).  If told by your health care provider, take low-dose aspirin daily starting at age 76. What's next?  Visit your health care provider once a year for a well check visit.  Ask your health care provider how often you should have your eyes and teeth checked.  Stay up to date on all vaccines. This information is not intended to replace advice given to you by your health care provider. Make sure you discuss any questions you have with your health care provider. Document Revised: 11/26/2017 Document Reviewed: 11/26/2017 Elsevier Patient Education  2020 [employer].

## 2019-12-26 ENCOUNTER — Other Ambulatory Visit: Payer: BC Managed Care – PPO

## 2020-03-05 ENCOUNTER — Ambulatory Visit: Payer: Self-pay

## 2020-03-05 ENCOUNTER — Other Ambulatory Visit: Payer: Self-pay | Admitting: Family Medicine

## 2020-03-05 ENCOUNTER — Other Ambulatory Visit: Payer: Self-pay

## 2020-03-05 DIAGNOSIS — M79671 Pain in right foot: Secondary | ICD-10-CM

## 2020-03-06 ENCOUNTER — Encounter: Payer: Self-pay | Admitting: *Deleted

## 2020-06-20 ENCOUNTER — Ambulatory Visit: Payer: 59 | Admitting: Family Medicine

## 2020-07-23 ENCOUNTER — Other Ambulatory Visit: Payer: Self-pay

## 2020-07-24 ENCOUNTER — Encounter: Payer: Self-pay | Admitting: Family Medicine

## 2020-07-24 ENCOUNTER — Ambulatory Visit: Payer: 59 | Admitting: Family Medicine

## 2020-07-24 VITALS — BP 115/68 | HR 63 | Temp 97.6°F | Ht 66.0 in | Wt 229.8 lb

## 2020-07-24 DIAGNOSIS — M25551 Pain in right hip: Secondary | ICD-10-CM | POA: Diagnosis not present

## 2020-07-24 DIAGNOSIS — M25511 Pain in right shoulder: Secondary | ICD-10-CM

## 2020-07-24 DIAGNOSIS — Z0000 Encounter for general adult medical examination without abnormal findings: Secondary | ICD-10-CM | POA: Diagnosis not present

## 2020-07-24 DIAGNOSIS — E669 Obesity, unspecified: Secondary | ICD-10-CM | POA: Diagnosis not present

## 2020-07-24 DIAGNOSIS — M25552 Pain in left hip: Secondary | ICD-10-CM

## 2020-07-24 DIAGNOSIS — F418 Other specified anxiety disorders: Secondary | ICD-10-CM

## 2020-07-24 DIAGNOSIS — M25512 Pain in left shoulder: Secondary | ICD-10-CM | POA: Diagnosis not present

## 2020-07-24 LAB — COMPREHENSIVE METABOLIC PANEL
ALT: 9 U/L (ref 0–35)
AST: 13 U/L (ref 0–37)
Albumin: 3.5 g/dL (ref 3.5–5.2)
Alkaline Phosphatase: 69 U/L (ref 39–117)
BUN: 13 mg/dL (ref 6–23)
CO2: 30 mEq/L (ref 19–32)
Calcium: 8.8 mg/dL (ref 8.4–10.5)
Chloride: 104 mEq/L (ref 96–112)
Creatinine, Ser: 0.7 mg/dL (ref 0.40–1.20)
GFR: 102.49 mL/min (ref >60.00)
Glucose, Bld: 96 mg/dL (ref 70–99)
Potassium: 4.6 mEq/L (ref 3.5–5.1)
Sodium: 139 mEq/L (ref 135–145)
Total Bilirubin: 0.4 mg/dL (ref 0.2–1.2)
Total Protein: 6 g/dL (ref 6.0–8.3)

## 2020-07-24 LAB — URINALYSIS, ROUTINE W REFLEX MICROSCOPIC
Bilirubin Urine: NEGATIVE
Hgb urine dipstick: NEGATIVE
Ketones, ur: NEGATIVE
Leukocytes,Ua: NEGATIVE
Nitrite: NEGATIVE
RBC / HPF: NONE SEEN (ref 0–?)
Specific Gravity, Urine: 1.01 (ref 1.000–1.030)
Total Protein, Urine: NEGATIVE
Urine Glucose: NEGATIVE
Urobilinogen, UA: 0.2 (ref 0.0–1.0)
pH: 7 (ref 5.0–8.0)

## 2020-07-24 LAB — LIPID PANEL
Cholesterol: 163 mg/dL (ref 0–200)
HDL: 46.2 mg/dL (ref >39.00)
LDL Cholesterol: 102 mg/dL — ABNORMAL HIGH (ref 0–99)
NonHDL: 116.33
Total CHOL/HDL Ratio: 4
Triglycerides: 74 mg/dL (ref 0.0–149.0)
VLDL: 14.8 mg/dL (ref 0.0–40.0)

## 2020-07-24 LAB — CBC
HCT: 40.2 % (ref 36.0–46.0)
Hemoglobin: 13.5 g/dL (ref 12.0–15.0)
MCHC: 33.5 g/dL (ref 30.0–36.0)
MCV: 90.1 fl (ref 78.0–100.0)
Platelets: 191 10*3/uL (ref 150.0–400.0)
RBC: 4.46 Mil/uL (ref 3.87–5.11)
RDW: 13.4 % (ref 11.5–15.5)
WBC: 5.8 10*3/uL (ref 4.0–10.5)

## 2020-07-24 LAB — TSH: TSH: 1.37 u[IU]/mL (ref 0.35–4.50)

## 2020-07-24 LAB — SEDIMENTATION RATE: Sed Rate: 13 mm/hr (ref 0–20)

## 2020-07-24 NOTE — Progress Notes (Signed)
Established Patient Office Visit  Subjective:  Patient ID: Tracey Crane, female    DOB: 06/26/1970  Age: 50 y.o. MRN: [id number]  CC:  Chief Complaint  Patient presents with  . Follow-up    6 month follow up, C/O joint pains.     HPI Tracey Crane presents for a health check, follow-up for anxiety and depression.  She had decided to leave [employer]acey Crane and is now working in a warehouse.  There is a lot of walking and over shoulder lifting.  She is much happier.  There is far less stress.  She is able to see her Tracey Crane nightly and have dinner with him.  She has developed some pain in her lateral proximal thighs shoulders.  There is some early morning stiffness.  She denies night sweats or weight loss.  Continues to do well with Lexapro.  She has responded well to it over the years.  She is menopausal.  Continues with supplemental estrogen as applied by her GYN provider.  Past Medical History:  Diagnosis Date  . Anxiety   . Chronic kidney disease 04/30/2003   kidney stones blocked right kidney   . Kidney failure   . Kidney stones     Past Surgical History:  Procedure Laterality Date  . KIDNEY SURGERY    . TUBAL LIGATION      Family History  Problem Relation Age of Onset  . Diabetes Mother   . Cancer Mother   . Diabetes Maternal Grandmother   . Alzheimer's disease Maternal Grandmother     Social History   Socioeconomic History  . Marital status: Single    Spouse name: Not on file  . Number of children: Not on file  . Years of education: Not on file  . Highest education level: Not on file  Occupational History  . Not on file  Tobacco Use  . Smoking status: Former Smoker    Types: Cigarettes    Quit date: 04/10/2017    Years since quitting: 3.2  . Smokeless tobacco: Former Network engineer  . Vaping Use: Some days  Substance and Sexual Activity  . Alcohol use: Yes    Alcohol/week: 1.0 standard drink    Types: 1 Glasses of wine per week  . Drug use: Never  .  Sexual activity: Yes  Other Topics Concern  . Not on file  Social History Narrative   ** Merged History Encounter **       Social Determinants of Health   Financial Resource Strain: Not on file  Food Insecurity: Not on file  Transportation Needs: Not on file  Physical Activity: Not on file  Stress: Not on file  Social Connections: Not on file  Intimate Partner Violence: Not on file    Outpatient Medications Prior to Visit  Medication Sig Dispense Refill  . acetaminophen (TYLENOL) 500 MG tablet Take 1,000 mg by mouth as needed. pain    . escitalopram (LEXAPRO) 20 MG tablet Take 20 mg by mouth daily.     No facility-administered medications prior to visit.    Not on File  ROS Review of Systems  Constitutional: Negative for chills, diaphoresis, fatigue, fever and unexpected weight change.  HENT: Negative.   Eyes: Negative for photophobia and visual disturbance.  Respiratory: Negative.   Cardiovascular: Negative.   Gastrointestinal: Negative.   Endocrine: Negative for polyphagia and polyuria.  Genitourinary: Negative.   Musculoskeletal: Positive for arthralgias and myalgias. Negative for back pain and gait problem.  Neurological: Negative  for speech difficulty and weakness.  Psychiatric/Behavioral: Negative.       Objective:    Physical Exam Constitutional:      General: She is not in acute distress.    Appearance: Normal appearance. She is obese. She is not ill-appearing, toxic-appearing or diaphoretic.  HENT:     Head: Normocephalic and atraumatic.     Right Ear: Tympanic membrane, ear canal and external ear normal.     Left Ear: Tympanic membrane, ear canal and external ear normal.     Mouth/Throat:     Mouth: Mucous membranes are moist.     Pharynx: Oropharynx is clear. No oropharyngeal exudate or posterior oropharyngeal erythema.  Eyes:     General: No scleral icterus.       Right eye: No discharge.        Left eye: No discharge.     Extraocular  Movements: Extraocular movements intact.     Conjunctiva/sclera: Conjunctivae normal.     Pupils: Pupils are equal, round, and reactive to light.  Cardiovascular:     Rate and Rhythm: Normal rate and regular rhythm.  Pulmonary:     Effort: Pulmonary effort is normal.     Breath sounds: Normal breath sounds.  Abdominal:     General: Bowel sounds are normal.  Musculoskeletal:     Right shoulder: No deformity or tenderness. Normal range of motion.     Left shoulder: No deformity or tenderness. Normal range of motion.     Cervical back: No rigidity or tenderness.     Right hip: No deformity or tenderness. Normal range of motion.     Left hip: No deformity or tenderness. Normal range of motion.  Lymphadenopathy:     Cervical: No cervical adenopathy.  Skin:    General: Skin is warm and dry.  Neurological:     Mental Status: She is alert and oriented to person, place, and time.  Psychiatric:        Mood and Affect: Mood normal.        Behavior: Behavior normal.     BP 115/68   Pulse 63   Temp 97.6 F (36.4 C)   Ht _0  (1.676 m)   Wt 229 lb 12.8 oz (104.2 kg)   SpO2 95%   BMI 37.09 kg/m  Wt Readings from Last 3 Encounters:  07/24/20 229 lb 12.8 oz (104.2 kg)  12/22/19 233 lb 6.4 oz (105.9 kg)  06/07/19 247 lb 3.2 oz (112.1 kg)     Health Maintenance Due  Topic Date Due  . Hepatitis C Screening  Never done  . HIV Screening  Never done  . PAP SMEAR-Modifier  Never done  . COLONOSCOPY (Pts 45-5yr Insurance coverage will need to be confirmed)  Never done    There are no preventive care reminders to display for this patient.  No results found for: TSH No results found for: WBC, HGB, HCT, MCV, PLT No results found for: Tracey, K, CHLORIDE, CO2, GLUCOSE, BUN, CREATININE, BILITOT, ALKPHOS, AST, ALT, PROT, ALBUMIN, CALCIUM, ANIONGAP, EGFR, GFR No results found for: CHOL No results found for: HDL No results found for: LDLCALC No results found for: TRIG No results found for:  CHOLHDL No results found for: HGBA1C    Assessment & Plan:   Problem List Items Addressed This Visit      Other   Obesity (BMI 35.0-39.9 without comorbidity)   Anxiety with depression   Relevant Orders   TSH   Healthcare maintenance - Primary  Relevant Orders   CBC   Comprehensive metabolic panel   Lipid panel   Urinalysis, Routine w reflex microscopic   Pain of both shoulder joints   Relevant Orders   Rheumatoid Factor   Sedimentation rate   Pain of both hip joints   Relevant Orders   Rheumatoid Factor   Sedimentation rate      No orders of the defined types were placed in this encounter.   Follow-up: Return in about 1 year (around 07/24/2021), or if symptoms worsen or fail to improve.  Given information on health maintenance and disease prevention.  Gave her information on calorie counting to help lose weight.  Am hopeful that leaving her stressful situation will help her in so many ways. We discussed losing weight and she is trying. Okay to continue 3 264m Ibu before work each day for now.  WLibby Maw MD

## 2020-07-24 NOTE — Patient Instructions (Signed)
Health Maintenance, Female Adopting a healthy lifestyle and getting preventive care are important in promoting health and wellness. Ask your health care provider about:  The right schedule for you to have regular tests and exams.  Things you can do on your own to prevent diseases and keep yourself healthy. What should I know about diet, weight, and exercise? Eat a healthy diet  Eat a diet that includes plenty of vegetables, fruits, low-fat dairy products, and lean protein.  Do not eat a lot of foods that are high in solid fats, added sugars, or sodium.   Maintain a healthy weight Body mass index (BMI) is used to identify weight problems. It estimates body fat based on height and weight. Your health care provider can help determine your BMI and help you achieve or maintain a healthy weight. Get regular exercise Get regular exercise. This is one of the most important things you can do for your health. Most adults should:  Exercise for at least 150 minutes each week. The exercise should increase your heart rate and make you sweat (moderate-intensity exercise).  Do strengthening exercises at least twice a week. This is in addition to the moderate-intensity exercise.  Spend less time sitting. Even light physical activity can be beneficial. Watch cholesterol and blood lipids Have your blood tested for lipids and cholesterol at 48 years of age, then have this test every 5 years. Have your cholesterol levels checked more often if:  Your lipid or cholesterol levels are high.  You are older than 48 years of age.  You are at high risk for heart disease. What should I know about cancer screening? Depending on your health history and family history, you may need to have cancer screening at various ages. This may include screening for:  Breast cancer.  Cervical cancer.  Colorectal cancer.  Skin cancer.  Lung cancer. What should I know about heart disease, diabetes, and high blood  pressure? Blood pressure and heart disease  High blood pressure causes heart disease and increases the risk of stroke. This is more likely to develop in people who have high blood pressure readings, are of African descent, or are overweight.  Have your blood pressure checked: ? Every 3-5 years if you are 59-59 years of age. ? Every year if you are 63 years old or older. Diabetes Have regular diabetes screenings. This checks your fasting blood sugar level. Have the screening done:  Once every three years after age 58 if you are at a normal weight and have a low risk for diabetes.  More often and at a younger age if you are overweight or have a high risk for diabetes. What should I know about preventing infection? Hepatitis B If you have a higher risk for hepatitis B, you should be screened for this virus. Talk with your health care provider to find out if you are at risk for hepatitis B infection. Hepatitis C Testing is recommended for:  Everyone born from [birth year] through 1963.  Anyone with known risk factors for hepatitis C. Sexually transmitted infections (STIs)  Get screened for STIs, including gonorrhea and chlamydia, if: ? You are sexually active and are younger than 48 years of age. ? You are older than 48 years of age and your health care provider tells you that you are at risk for this type of infection. ? Your sexual activity has changed since you were last screened, and you are at increased risk for chlamydia or gonorrhea. Ask your health care provider  if you are at risk.  Ask your health care provider about whether you are at high risk for HIV. Your health care provider may recommend a prescription medicine to help prevent HIV infection. If you choose to take medicine to prevent HIV, you should first get tested for HIV. You should then be tested every 3 months for as long as you are taking the medicine. Pregnancy  If you are about to stop having your period (premenopausal) and  you may become pregnant, seek counseling before you get pregnant.  Take 400 to 800 micrograms (mcg) of folic acid every day if you become pregnant.  Ask for birth control (contraception) if you want to prevent pregnancy. Osteoporosis and menopause Osteoporosis is a disease in which the bones lose minerals and strength with aging. This can result in bone fractures. If you are 62 years old or older, or if you are at risk for osteoporosis and fractures, ask your health care provider if you should:  Be screened for bone loss.  Take a calcium or vitamin D supplement to lower your risk of fractures.  Be given hormone replacement therapy (HRT) to treat symptoms of menopause. Follow these instructions at home: Lifestyle  Do not use any products that contain nicotine or tobacco, such as cigarettes, e-cigarettes, and chewing tobacco. If you need help quitting, ask your health care provider.  Do not use street drugs.  Do not share needles.  Ask your health care provider for help if you need support or information about quitting drugs. Alcohol use  Do not drink alcohol if: ? Your health care provider tells you not to drink. ? You are pregnant, may be pregnant, or are planning to become pregnant.  If you drink alcohol: ? Limit how much you use to 0-1 drink a day. ? Limit intake if you are breastfeeding.  Be aware of how much alcohol is in your drink. In the [place] one drink equals one 12 oz bottle of beer (355 mL), one 5 oz glass of wine (148 mL), or one 1 oz glass of hard liquor (44 mL). General instructions  Schedule regular health, dental, and eye exams.  Stay current with your vaccines.  Tell your health care provider if: ? You often feel depressed. ? You have ever been abused or do not feel safe at home. Summary  Adopting a healthy lifestyle and getting preventive care are important in promoting health and wellness.  Follow your health care provider's instructions about healthy  diet, exercising, and getting tested or screened for diseases.  Follow your health care provider's instructions on monitoring your cholesterol and blood pressure. This information is not intended to replace advice given to you by your health care provider. Make sure you discuss any questions you have with your health care provider. Document Revised: 03/10/2018 Document Reviewed: 03/10/2018 Elsevier Patient Education  2021 Elsevier Inc.  Preventive Care 39-27 Years Old, Female Preventive care refers to lifestyle choices and visits with your health care provider that can promote health and wellness. This includes:  A yearly physical exam. This is also called an annual wellness visit.  Regular dental and eye exams.  Immunizations.  Screening for certain conditions.  Healthy lifestyle choices, such as: ? Eating a healthy diet. ? Getting regular exercise. ? Not using drugs or products that contain nicotine and tobacco. ? Limiting alcohol use. What can I expect for my preventive care visit? Physical exam Your health care provider will check your:  Height and weight. These may  be used to calculate your BMI (body mass index). BMI is a measurement that tells if you are at a healthy weight.  Heart rate and blood pressure.  Body temperature.  Skin for abnormal spots. Counseling Your health care provider may ask you questions about your:  Past medical problems.  Family's medical history.  Alcohol, tobacco, and drug use.  Emotional well-being.  Home life and relationship well-being.  Sexual activity.  Diet, exercise, and sleep habits.  Work and work Statistician.  Access to firearms.  Method of birth control.  Menstrual cycle.  Pregnancy history. What immunizations do I need? Vaccines are usually given at various ages, according to a schedule. Your health care provider will recommend vaccines for you based on your age, medical history, and lifestyle or other factors,  such as travel or where you work.   What tests do I need? Blood tests  Lipid and cholesterol levels. These may be checked every 5 years, or more often if you are over 65 years old.  Hepatitis C test.  Hepatitis B test. Screening  Lung cancer screening. You may have this screening every year starting at age 90 if you have a 30-pack-year history of smoking and currently smoke or have quit within the past 15 years.  Colorectal cancer screening. ? All adults should have this screening starting at age 16 and continuing until age 38. ? Your health care provider may recommend screening at age 30 if you are at increased risk. ? You will have tests every 1-10 years, depending on your results and the type of screening test.  Diabetes screening. ? This is done by checking your blood sugar (glucose) after you have not eaten for a while (fasting). ? You may have this done every 1-3 years.  Mammogram. ? This may be done every 1-2 years. ? Talk with your health care provider about when you should start having regular mammograms. This may depend on whether you have a family history of breast cancer.  BRCA-related cancer screening. This may be done if you have a family history of breast, ovarian, tubal, or peritoneal cancers.  Pelvic exam and Pap test. ? This may be done every 3 years starting at age 58. ? Starting at age 60, this may be done every 5 years if you have a Pap test in combination with an HPV test. Other tests  STD (sexually transmitted disease) testing, if you are at risk.  Bone density scan. This is done to screen for osteoporosis. You may have this scan if you are at high risk for osteoporosis. Talk with your health care provider about your test results, treatment options, and if necessary, the need for more tests. Follow these instructions at home: Eating and drinking  Eat a diet that includes fresh fruits and vegetables, whole grains, lean protein, and low-fat dairy  products.  Take vitamin and mineral supplements as recommended by your health care provider.  Do not drink alcohol if: ? Your health care provider tells you not to drink. ? You are pregnant, may be pregnant, or are planning to become pregnant.  If you drink alcohol: ? Limit how much you have to 0-1 drink a day. ? Be aware of how much alcohol is in your drink. In the [place] one drink equals one 12 oz bottle of beer (355 mL), one 5 oz glass of wine (148 mL), or one 1 oz glass of hard liquor (44 mL).   Lifestyle  Take daily care of your teeth and  gums. Brush your teeth every morning and night with fluoride toothpaste. Floss one time each day.  Stay active. Exercise for at least 30 minutes 5 or more days each week.  Do not use any products that contain nicotine or tobacco, such as cigarettes, e-cigarettes, and chewing tobacco. If you need help quitting, ask your health care provider.  Do not use drugs.  If you are sexually active, practice safe sex. Use a condom or other form of protection to prevent STIs (sexually transmitted infections).  If you do not wish to become pregnant, use a form of birth control. If you plan to become pregnant, see your health care provider for a prepregnancy visit.  If told by your health care provider, take low-dose aspirin daily starting at age 4.  Find healthy ways to cope with stress, such as: ? Meditation, yoga, or listening to music. ? Journaling. ? Talking to a trusted person. ? Spending time with friends and family. Safety  Always wear your seat belt while driving or riding in a vehicle.  Do not drive: ? If you have been drinking alcohol. Do not ride with someone who has been drinking. ? When you are tired or distracted. ? While texting.  Wear a helmet and other protective equipment during sports activities.  If you have firearms in your house, make sure you follow all gun safety procedures. What's next?  Visit your health care provider  once a year for an annual wellness visit.  Ask your health care provider how often you should have your eyes and teeth checked.  Stay up to date on all vaccines. This information is not intended to replace advice given to you by your health care provider. Make sure you discuss any questions you have with your health care provider. Document Revised: 12/20/2019 Document Reviewed: 11/26/2017 Elsevier Patient Education  2021 [employer] Calories are units of energy. Your body needs a certain number of calories from food to keep going throughout the day. When you eat or drink more calories than your body needs, your body stores the extra calories mostly as fat. When you eat or drink fewer calories than your body needs, your body burns fat to get the energy it needs. Calorie counting means keeping track of how many calories you eat and drink each day. Calorie counting can be helpful if you need to lose weight. If you eat fewer calories than your body needs, you should lose weight. Ask your health care provider what a healthy weight is for you. For calorie counting to work, you will need to eat the right number of calories each day to lose a healthy amount of weight per week. A dietitian can help you figure out how many calories you need in a day and will suggest ways to reach your calorie goal.  A healthy amount of weight to lose each week is usually 1-2 lb (0.5-0.9 kg). This usually means that your daily calorie intake should be reduced by 500-750 calories.  Eating 1,200-1,500 calories a day can help most women lose weight.  Eating 1,500-1,800 calories a day can help most men lose weight. What do I need to know about calorie counting? Work with your health care provider or dietitian to determine how many calories you should get each day. To meet your daily calorie goal, you will need to:  Find out how many calories are in each food that you would like to eat. Try  to do this  before you eat.  Decide how much of the food you plan to eat.  Keep a food log. Do this by writing down what you ate and how many calories it had. To successfully lose weight, it is important to balance calorie counting with a healthy lifestyle that includes regular activity. Where do I find calorie information? The number of calories in a food can be found on a Nutrition Facts label. If a food does not have a Nutrition Facts label, try to look up the calories online or ask your dietitian for help. Remember that calories are listed per serving. If you choose to have more than one serving of a food, you will have to multiply the calories per serving by the number of servings you plan to eat. For example, the label on a package of bread might say that a serving size is 1 slice and that there are 90 calories in a serving. If you eat 1 slice, you will have eaten 90 calories. If you eat 2 slices, you will have eaten 180 calories.   How do I keep a food log? After each time that you eat, record the following in your food log as soon as possible:  What you ate. Be sure to include toppings, sauces, and other extras on the food.  How much you ate. This can be measured in cups, ounces, or number of items.  How many calories were in each food and drink.  The total number of calories in the food you ate. Keep your food log near you, such as in a pocket-sized notebook or on an app or website on your mobile phone. Some programs will calculate calories for you and show you how many calories you have left to meet your daily goal. What are some portion-control tips?  Know how many calories are in a serving. This will help you know how many servings you can have of a certain food.  Use a measuring cup to measure serving sizes. You could also try weighing out portions on a kitchen scale. With time, you will be able to estimate serving sizes for some foods.  Take time to put servings of different  foods on your favorite plates or in your favorite bowls and cups so you know what a serving looks like.  Try not to eat straight from a food's packaging, such as from a bag or box. Eating straight from the package makes it hard to see how much you are eating and can lead to overeating. Put the amount you would like to eat in a cup or on a plate to make sure you are eating the right portion.  Use smaller plates, glasses, and bowls for smaller portions and to prevent overeating.  Try not to multitask. For example, avoid watching TV or using your computer while eating. If it is time to eat, sit down at a table and enjoy your food. This will help you recognize when you are full. It will also help you be more mindful of what and how much you are eating. What are tips for following this plan? Reading food labels  Check the calorie count compared with the serving size. The serving size may be smaller than what you are used to eating.  Check the source of the calories. Try to choose foods that are high in protein, fiber, and vitamins, and low in saturated fat, trans fat, and sodium. Shopping  Read nutrition labels while you shop. This will help you make  healthy decisions about which foods to buy.  Pay attention to nutrition labels for low-fat or fat-free foods. These foods sometimes have the same number of calories or more calories than the full-fat versions. They also often have added sugar, starch, or salt to make up for flavor that was removed with the fat.  Make a grocery list of lower-calorie foods and stick to it. Cooking  Try to cook your favorite foods in a healthier way. For example, try baking instead of frying.  Use low-fat dairy products. Meal planning  Use more fruits and vegetables. One-half of your plate should be fruits and vegetables.  Include lean proteins, such as chicken, [place], and fish. Lifestyle Each week, aim to do one of the following:  150 minutes of moderate  exercise, such as walking.  75 minutes of vigorous exercise, such as running. General information  Know how many calories are in the foods you eat most often. This will help you calculate calorie counts faster.  Find a way of tracking calories that works for you. Get creative. Try different apps or programs if writing down calories does not work for you. What foods should I eat?  Eat nutritious foods. It is better to have a nutritious, high-calorie food, such as an avocado, than a food with few nutrients, such as a bag of potato chips.  Use your calories on foods and drinks that will fill you up and will not leave you hungry soon after eating. ? Examples of foods that fill you up are nuts and nut butters, vegetables, lean proteins, and high-fiber foods such as whole grains. High-fiber foods are foods with more than 5 g of fiber per serving.  Pay attention to calories in drinks. Low-calorie drinks include water and unsweetened drinks. The items listed above may not be a complete list of foods and beverages you can eat. Contact a dietitian for more information.   What foods should I limit? Limit foods or drinks that are not good sources of vitamins, minerals, or protein or that are high in unhealthy fats. These include:  Candy.  Other sweets.  Sodas, specialty coffee drinks, alcohol, and juice. The items listed above may not be a complete list of foods and beverages you should avoid. Contact a dietitian for more information. How do I count calories when eating out?  Pay attention to portions. Often, portions are much larger when eating out. Try these tips to keep portions smaller: ? Consider sharing a meal instead of getting your own. ? If you get your own meal, eat only half of it. Before you start eating, ask for a container and put half of your meal into it. ? When available, consider ordering smaller portions from the menu instead of full portions.  Pay attention to your food and  drink choices. Knowing the way food is cooked and what is included with the meal can help you eat fewer calories. ? If calories are listed on the menu, choose the lower-calorie options. ? Choose dishes that include vegetables, fruits, whole grains, low-fat dairy products, and lean proteins. ? Choose items that are boiled, broiled, grilled, or steamed. Avoid items that are buttered, battered, fried, or served with cream sauce. Items labeled as crispy are usually fried, unless stated otherwise. ? Choose water, low-fat milk, unsweetened iced tea, or other drinks without added sugar. If you want an alcoholic beverage, choose a lower-calorie option, such as a glass of wine or light beer. ? Ask for dressings, sauces, and syrups  on the side. These are usually high in calories, so you should limit the amount you eat. ? If you want a salad, choose a garden salad and ask for grilled meats. Avoid extra toppings such as bacon, cheese, or fried items. Ask for the dressing on the side, or ask for olive oil and vinegar or lemon to use as dressing.  Estimate how many servings of a food you are given. Knowing serving sizes will help you be aware of how much food you are eating at restaurants. Where to find more information  Centers for Disease Control and Prevention: http://www.wolf.info/  [place] Department of Agriculture: http://www.wilson-mendoza.org/ Summary  Calorie counting means keeping track of how many calories you eat and drink each day. If you eat fewer calories than your body needs, you should lose weight.  A healthy amount of weight to lose per week is usually 1-2 lb (0.5-0.9 kg). This usually means reducing your daily calorie intake by 500-750 calories.  The number of calories in a food can be found on a Nutrition Facts label. If a food does not have a Nutrition Facts label, try to look up the calories online or ask your dietitian for help.  Use smaller plates, glasses, and bowls for smaller portions and to prevent  overeating.  Use your calories on foods and drinks that will fill you up and not leave you hungry shortly after a meal. This information is not intended to replace advice given to you by your health care provider. Make sure you discuss any questions you have with your health care provider. Document Revised: 04/28/2019 Document Reviewed: 04/28/2019 Elsevier Patient Education  2021 [employer].

## 2020-07-25 LAB — RHEUMATOID FACTOR: Rhuematoid fact SerPl-aCnc: <14 IU/mL (ref <14)

## 2020-08-01 ENCOUNTER — Telehealth: Payer: Self-pay | Admitting: Family Medicine

## 2020-08-01 NOTE — Telephone Encounter (Signed)
Pt would like a cb concerning her most recent lab results. Please advise at [phone].

## 2020-08-01 NOTE — Telephone Encounter (Signed)
Patient notified VIA phone.  No questions.  Dm/cma ? ?

## 2020-10-07 ENCOUNTER — Other Ambulatory Visit: Payer: Self-pay

## 2020-10-07 ENCOUNTER — Encounter (HOSPITAL_COMMUNITY): Payer: Self-pay

## 2020-10-07 ENCOUNTER — Emergency Department (HOSPITAL_COMMUNITY)
Admission: EM | Admit: 2020-10-07 | Discharge: 2020-10-08 | Disposition: A | Discharge status: Home / Self Care | Payer: 59 | Attending: Emergency Medicine | Admitting: Emergency Medicine

## 2020-10-07 DIAGNOSIS — K5900 Constipation, unspecified: Secondary | ICD-10-CM | POA: Insufficient documentation

## 2020-10-07 DIAGNOSIS — R1084 Generalized abdominal pain: Secondary | ICD-10-CM | POA: Diagnosis present

## 2020-10-07 DIAGNOSIS — K8020 Calculus of gallbladder without cholecystitis without obstruction: Secondary | ICD-10-CM

## 2020-10-07 DIAGNOSIS — Z87891 Personal history of nicotine dependence: Secondary | ICD-10-CM | POA: Diagnosis not present

## 2020-10-07 DIAGNOSIS — N189 Chronic kidney disease, unspecified: Secondary | ICD-10-CM | POA: Insufficient documentation

## 2020-10-07 NOTE — ED Triage Notes (Signed)
Pt complains of mid abdominal pain that radiates to the right lower and left lower quadrants. She states that it started last Tuesday and has gotten worse. She has had multiple episodes of vomiting due to pain. Eating and physical activity make the pain worse.

## 2020-10-08 ENCOUNTER — Emergency Department (HOSPITAL_COMMUNITY): Payer: 59

## 2020-10-08 ENCOUNTER — Encounter (HOSPITAL_COMMUNITY): Payer: Self-pay

## 2020-10-08 LAB — COMPREHENSIVE METABOLIC PANEL
ALT: 16 U/L (ref 0–44)
AST: 18 U/L (ref 15–41)
Albumin: 4.2 g/dL (ref 3.5–5.0)
Alkaline Phosphatase: 90 U/L (ref 38–126)
Anion gap: 9 (ref 5–15)
BUN: 19 mg/dL (ref 6–20)
CO2: 29 mmol/L (ref 22–32)
Calcium: 9.9 mg/dL (ref 8.9–10.3)
Chloride: 103 mmol/L (ref 98–111)
Creatinine, Ser: 0.68 mg/dL (ref 0.44–1.00)
GFR, Estimated: >60 mL/min (ref >60)
Glucose, Bld: 123 mg/dL — ABNORMAL HIGH (ref 70–99)
Potassium: 4.1 mmol/L (ref 3.5–5.1)
Sodium: 141 mmol/L (ref 135–145)
Total Bilirubin: 0.4 mg/dL (ref 0.3–1.2)
Total Protein: 7.3 g/dL (ref 6.5–8.1)

## 2020-10-08 LAB — URINALYSIS, ROUTINE W REFLEX MICROSCOPIC
Bilirubin Urine: NEGATIVE
Glucose, UA: 50 mg/dL — AB
Ketones, ur: 5 mg/dL — AB
Leukocytes,Ua: NEGATIVE
Nitrite: NEGATIVE
Protein, ur: 100 mg/dL — AB
Specific Gravity, Urine: 1.021 (ref 1.005–1.030)
pH: 6 (ref 5.0–8.0)

## 2020-10-08 LAB — CBC
HCT: 43.7 % (ref 36.0–46.0)
Hemoglobin: 14.1 g/dL (ref 12.0–15.0)
MCH: 29.8 pg (ref 26.0–34.0)
MCHC: 32.3 g/dL (ref 30.0–36.0)
MCV: 92.4 fL (ref 80.0–100.0)
Platelets: 240 10*3/uL (ref 150–400)
RBC: 4.73 MIL/uL (ref 3.87–5.11)
RDW: 12.2 % (ref 11.5–15.5)
WBC: 8.9 10*3/uL (ref 4.0–10.5)
nRBC: 0 % (ref 0.0–0.2)

## 2020-10-08 LAB — I-STAT BETA HCG BLOOD, ED (MC, WL, AP ONLY): I-stat hCG, quantitative: <5 m[IU]/mL (ref <5)

## 2020-10-08 LAB — LIPASE, BLOOD: Lipase: 31 U/L (ref 11–51)

## 2020-10-08 MED ORDER — ONDANSETRON HCL 4 MG/2ML IJ SOLN
4.0000 mg | Freq: Once | INTRAMUSCULAR | Status: AC
  Administered 2020-10-08: 4 mg via INTRAVENOUS
  Filled 2020-10-08: qty 2

## 2020-10-08 MED ORDER — IOHEXOL 350 MG/ML SOLN
80.0000 mL | Freq: Once | INTRAVENOUS | Status: AC | PRN
  Administered 2020-10-08: 80 mL via INTRAVENOUS

## 2020-10-08 MED ORDER — HYDROMORPHONE HCL 1 MG/ML IJ SOLN
0.5000 mg | Freq: Once | INTRAMUSCULAR | Status: AC
  Administered 2020-10-08: 0.5 mg via INTRAVENOUS
  Filled 2020-10-08: qty 1

## 2020-10-08 MED ORDER — HYDROCODONE-ACETAMINOPHEN 5-325 MG PO TABS: 1.0000 | ORAL_TABLET | ORAL | 0 refills | Status: AC | PRN

## 2020-10-08 MED ORDER — ONDANSETRON 4 MG PO TBDP: 4.0000 mg | ORAL_TABLET | Freq: Three times a day (TID) | ORAL | 0 refills | Status: AC | PRN

## 2020-10-08 NOTE — ED Provider Notes (Signed)
[facility]-EMERGENCY DEPT Provider Note   CSN: [id number] Arrival date & time: 10/07/20  2156     History Chief Complaint  Patient presents with   Abdominal Pain   Nausea   Emesis    Tracey Crane is a 50 y.o. female.  Patient to ED for evaluation of abdominal pain that started 5 days ago. It was initially intermittent, now constant. No fever. She started vomiting yesterday. She reports her last bowel movement was several days ago and she is passing very little gas. No history of previous surgeries. No family history of bowel disease. No previous colonoscopies. The pain is strongest across the upper abdomen, goes around to the back and up to bilateral posterior shoulders. No SOB, Chest pain, cough, fever, urinary symptoms. No blood per rectum or hematemesis.   The history is provided by the patient. No language interpreter was used.  Abdominal Pain Associated symptoms: constipation, nausea and vomiting   Associated symptoms: no chest pain, no chills, no dysuria, no fever and no shortness of breath   Emesis Associated symptoms: abdominal pain   Associated symptoms: no chills and no fever       Past Medical History:  Diagnosis Date   Anxiety    Chronic kidney disease 04/30/2003   kidney stones blocked right kidney    Kidney failure    Kidney stones     Patient Active Problem List   Diagnosis Date Noted   Pain of both shoulder joints 07/24/2020   Pain of both hip joints 07/24/2020   Rash 12/22/2019   Asymptomatic varicose veins of right lower extremity 12/22/2019   Healthcare maintenance 12/22/2019   Obesity (BMI 35.0-39.9 without comorbidity) 06/07/2019   Need for Tdap vaccination 06/07/2019   Anxiety with depression 06/07/2019   Vitamin D deficiency 06/07/2019    Past Surgical History:  Procedure Laterality Date   KIDNEY SURGERY     TUBAL LIGATION       OB History   No obstetric history on file.     Family History  Problem Relation  Age of Onset   Diabetes Mother    Cancer Mother    Diabetes Maternal Grandmother    Alzheimer's disease Maternal Grandmother     Social History   Tobacco Use   Smoking status: Former    Pack years: 0.00    Types: Cigarettes    Quit date: 04/10/2017    Years since quitting: 3.4   Smokeless tobacco: Former  Building services engineer Use: Some days  Substance Use Topics   Alcohol use: Yes    Alcohol/week: 1.0 standard drink    Types: 1 Glasses of wine per week   Drug use: Never    Home Medications Prior to Admission medications   Medication Sig Start Date End Date Taking? Authorizing Provider  acetaminophen (TYLENOL) 500 MG tablet Take 1,000 mg by mouth as needed. pain    [provider]  escitalopram (LEXAPRO) 20 MG tablet Take 20 mg by mouth daily.    [provider]    Allergies    Patient has no allergy information on record.  Review of Systems   Review of Systems  Constitutional:  Negative for chills and fever.  HENT: Negative.    Respiratory: Negative.  Negative for shortness of breath.   Cardiovascular: Negative.  Negative for chest pain.  Gastrointestinal:  Positive for abdominal pain, constipation, nausea and vomiting. Negative for anal bleeding.  Genitourinary: Negative.  Negative for dysuria and flank  pain.  Skin: Negative.  Negative for color change.  Neurological: Negative.  Negative for weakness.   Physical Exam Updated Vital Signs BP (!) 170/76 (BP Location: Left Arm)   Pulse (!) 47   Temp 98 F (36.7 C) (Oral)   Resp 16   Ht 5\' 5"  (1.651 m)   Wt 103.4 kg   SpO2 98%   BMI 37.94 kg/m   Physical Exam Vitals and nursing note reviewed.  Constitutional:      Appearance: She is well-developed. She is obese.  HENT:     Head: Normocephalic.  Cardiovascular:     Rate and Rhythm: Normal rate and regular rhythm.     Heart sounds: No murmur heard. Pulmonary:     Effort: Pulmonary effort is normal.     Breath sounds: Normal breath  sounds. No wheezing, rhonchi or rales.  Abdominal:     General: Bowel sounds are decreased.     Palpations: Abdomen is soft.     Tenderness: There is generalized abdominal tenderness. There is no guarding or rebound.  Musculoskeletal:        General: Normal range of motion.     Cervical back: Normal range of motion and neck supple.  Skin:    General: Skin is warm and dry.  Neurological:     General: No focal deficit present.     Mental Status: She is alert and oriented to person, place, and time.    ED Results / Procedures / Treatments   Labs (all labs ordered are listed, but only abnormal results are displayed) Labs Reviewed  COMPREHENSIVE METABOLIC PANEL - Abnormal; Notable for the following components:      Result Value   Glucose, Bld 123 (*)    All other components within normal limits  LIPASE, BLOOD  CBC  URINALYSIS, ROUTINE W REFLEX MICROSCOPIC  I-STAT BETA HCG BLOOD, ED ([facility], [facility], [facility] ONLY)    EKG None  Radiology No results found.  Procedures Procedures   Medications Ordered in ED Medications - No data to display  ED Course  I have reviewed the triage vital signs and the nursing notes.  Pertinent labs & imaging results that were available during my care of the patient were reviewed by me and considered in my medical decision making (see chart for details).  Clinical Course as of 10/08/20 0521  Mon Oct 08, 2020  0108 Not in the room for eval yet [SU]    Clinical Course User Index [SU] 0109, PA-C   MDM Rules/Calculators/A&P                          Patient to ED with upper abdominal pain, vomiting, no fever as detailed in the HPI.   Uncomfortable appearing patient. VSS, afebrile. IV started for medications and CT abd/pel. Labs reviewed and are unremarkable - no leukocytosis. Pain medication with improvement.   On recheck the patient is more comfortable. Abdomen much less tender in upper quadrants. CT shows gall stones without cholecystitis,  ?stone in the neck/CBD. Elpidio Anis ordered. Patient updated.   [place] confirms to cholecystitis. There are multiple stones, one in the neck of the GB. Re-exam: she has no tenderness on palpation of the abdomen. Discussed outpatient surgical follow up - will provide referral. Discussed specific symptoms of cholecystitis that would warrant return to the ED. Patient is comfortable with plan of discharge.  Final Clinical Impression(s) / ED Diagnoses Final diagnoses:  None   Cholelithiasis  Rx / DC Orders ED Discharge Orders     None        Elpidio Anis, Cordelia Poche 10/08/20 0528    Sabas Sous, MD 10/08/20 [phone]

## 2020-10-08 NOTE — Discharge Instructions (Addendum)
Use Zofran and/or Norco for nausea and pain if needed. Avoid high fat foods for additional control of gall bladder pain.   Follow up with [facility] for elective gall bladder removal. If you run a fever, have severe pain, uncontrolled vomiting or new concern, please return to the emergency department for further intervention.

## 2020-11-08 ENCOUNTER — Ambulatory Visit: Payer: Self-pay | Admitting: Surgery

## 2020-11-08 NOTE — H&P (Signed)
Tracey Crane [id number]   Referring Provider: Emergency department; primary care provider is Dr. Doreene Burke  Subjective   Chief Complaint: No chief complaint on file.     History of Present Illness:    50 year old woman who presents for evaluation of biliary colic.  She has intermittently for at least a year had what she felt was gas pain/dyspepsia.  This usually happened while she was at work while she was already standing and moving around, and she did not think much of it at the time. She was seen in the emergency department on July 11 with a 5-day history of abdominal pain which had progressed from intermittent to constant and was associated with nausea and emesis.  This was most prominent across the upper abdomen and radiated to the back and bilateral shoulder blade area. Lab work including CMP and CBC were unremarkable, her urinalysis did demonstrate hematuria.  CT scan showed a nonobstructing 61mm right renal stone, cholelithiasis without evidence of cholecystitis.  Subsequent ultrasound confirmed numerous gallstones without sonographic evidence of cholecystitis.  A 1 cm stone appeared fixed at the gallbladder neck, common bile duct 6 to 7 mm. Ultimately her pain resolved in the ER and she was able to be discharged home.  She has had about 1 attack per week, lasting up to 6 hours, since then.  Previous tubal ligation, no other abdominal surgery.   Review of Systems: A complete review of systems was obtained from the patient.  I have reviewed this information and discussed as appropriate with the patient.  See HPI as well for other ROS.   Medical History: Past Medical History:  Diagnosis Date   Anxiety       No Known Allergies  Current Outpatient Medications on File Prior to Visit  Medication Sig Dispense Refill   escitalopram oxalate (LEXAPRO) 20 MG tablet Take 20 mg by mouth once daily     HYDROcodone-acetaminophen (NORCO) 10-325 mg tablet Take 1 tablet by mouth every 6  (six) hours as needed     No current facility-administered medications on file prior to visit.    Family History  Problem Relation Age of Onset   Diabetes Mother      Social History   Tobacco Use  Smoking Status Former Smoker   Quit date: 2018   Years since quitting: 4.6  Smokeless Tobacco Never Used     Social History   Socioeconomic History   Marital status: Radiation protection practitioner  Tobacco Use   Smoking status: Former Smoker    Quit date: 2018    Years since quitting: 4.6   Smokeless tobacco: Never Used  Building services engineer Use: Never used  Substance and Sexual Activity   Alcohol use: Yes   Drug use: Defer   Sexual activity: Defer    Objective:    Vitals:   11/08/20 1037  Pulse: 71  Temp: 36.9 C (98.4 F)  SpO2: 98%  Weight: 100.2 kg (221 lb)  Height: 165.1 cm (5\' 5" )    Body mass index is 36.78 kg/m.  Alert, well-appearing Unlabored respirations Abdomen soft, nontender, nondistended No lower extremity edema    Assessment and Plan:  Diagnoses and all orders for this visit:  Biliary colic -     CCS Case Posting Request; Future    I recommend proceeding with laparoscopic or robotic cholecystectomy with possible cholangiogram.  Discussed the relevant anatomy using a diagram to demonstrate, and went over surgical technique.  Discussed risks of surgery including bleeding, pain,  scarring, intraabdominal injury specifically to the major bile ducts and sequelae, bile leak, conversion to open surgery, failure to resolve symptoms, blood clots/ pulmonary embolus, heart attack, pneumonia, stroke, death. Questions welcomed and answered to patient's satisfaction.  We will schedule as soon as possible.   Alwaleed Obeso Carlye Grippe, MD

## 2020-12-07 ENCOUNTER — Other Ambulatory Visit: Payer: Self-pay | Admitting: Family Medicine

## 2020-12-07 MED ORDER — ESCITALOPRAM OXALATE 20 MG PO TABS: 20.0000 mg | ORAL_TABLET | Freq: Every day | ORAL | 1 refills | Status: DC

## 2020-12-07 NOTE — Telephone Encounter (Signed)
Refill request for pending Rx last OV 07/24/20. Please advise

## 2020-12-07 NOTE — Telephone Encounter (Signed)
Pt requesting refill on lexapro Rx

## 2021-06-07 ENCOUNTER — Other Ambulatory Visit: Payer: Self-pay | Admitting: Family Medicine

## 2022-02-18 IMAGING — CT CT ABD-PELV W/ CM
2 of 5 series · 16 of 46 positions shown, 18 images · IV contrast (omnipaque)
Comparison: 01/09/2012 by report

CLINICAL DATA: Mid abdominal pain radiating into the lower pelvis

EXAM:
CT ABDOMEN AND PELVIS WITH CONTRAST
TECHNIQUE: Multidetector CT imaging of the abdomen and pelvis was performed
using the standard protocol following bolus administration of
intravenous contrast.
CONTRAST:  80mL OMNIPAQUE IOHEXOL 350 MG/ML SOLN

[Series 2: axial st · axial · 0.95mm/px · z∈[-454,-29]mm · 13 of 101 slices shown, 15 images]
[im 8/101  soft-tissue]
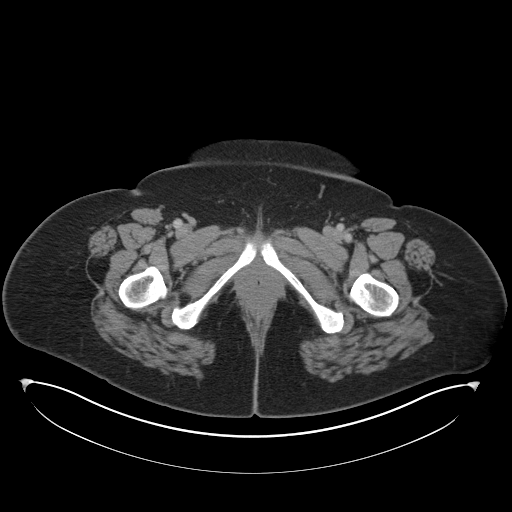
[im 8/101  bone]
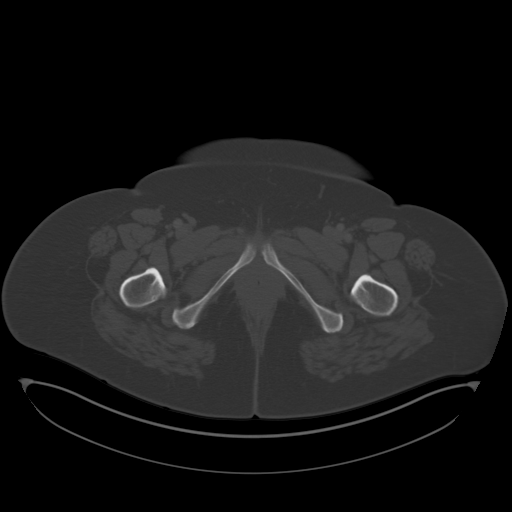
[im 15/101  soft-tissue]
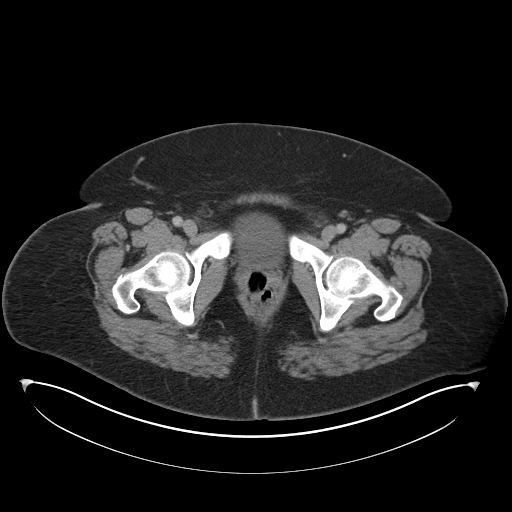
[im 22/101  soft-tissue]
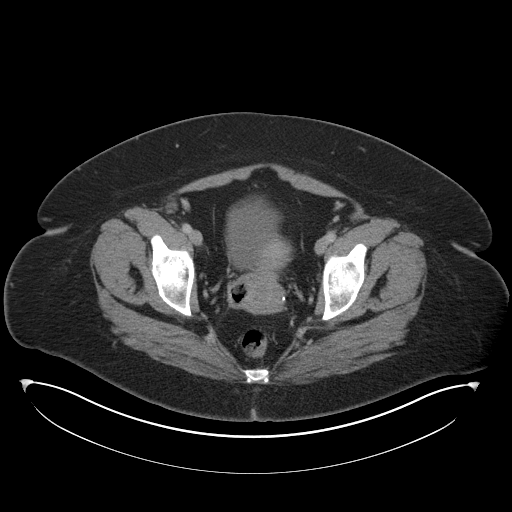
[im 29/101  soft-tissue]
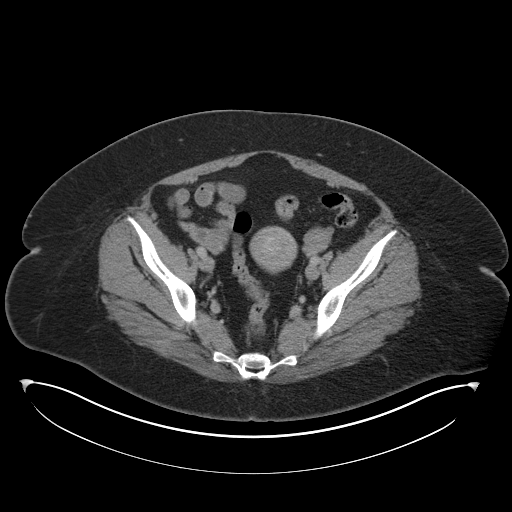
[im 36/101  soft-tissue]
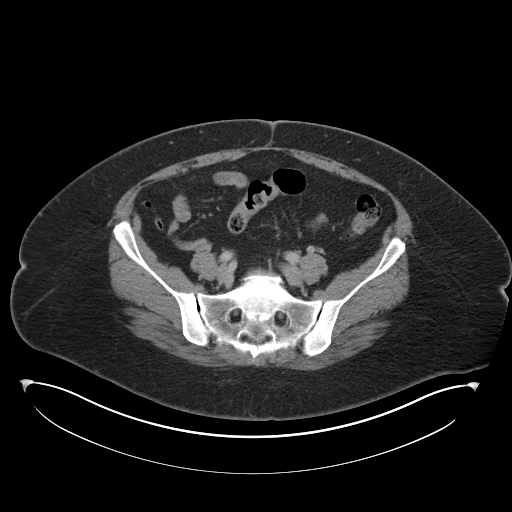
[im 43/101  soft-tissue]
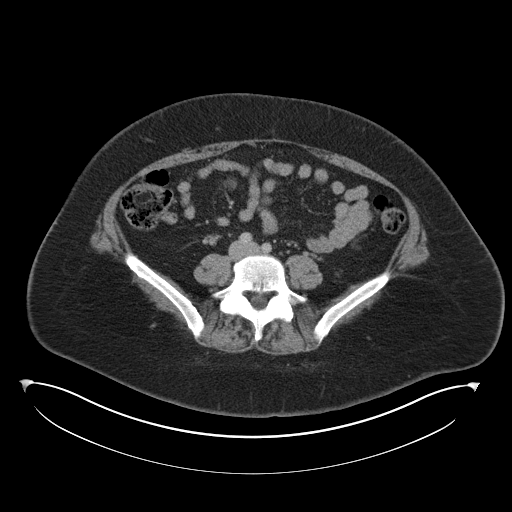
[im 51/101  soft-tissue]
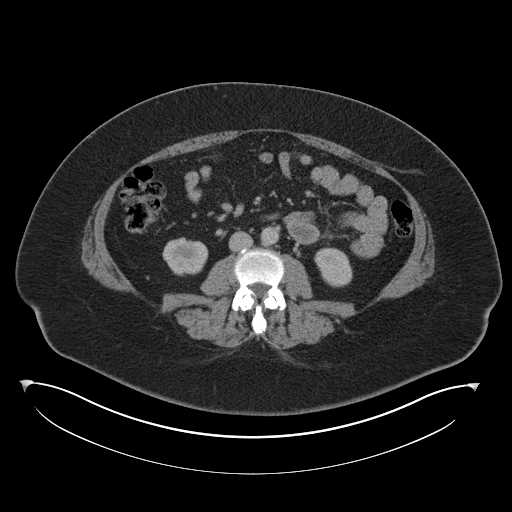
[im 58/101  soft-tissue]
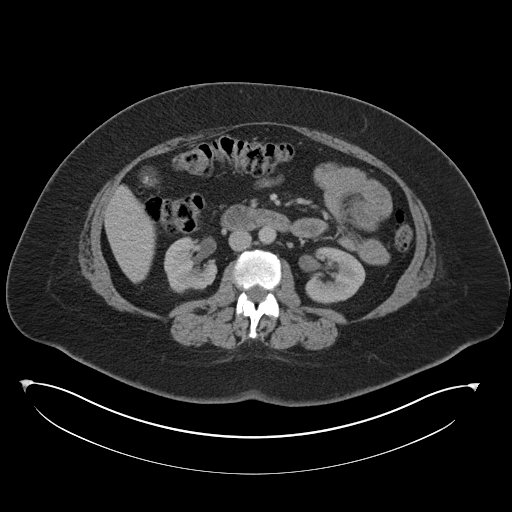
[im 65/101  soft-tissue]
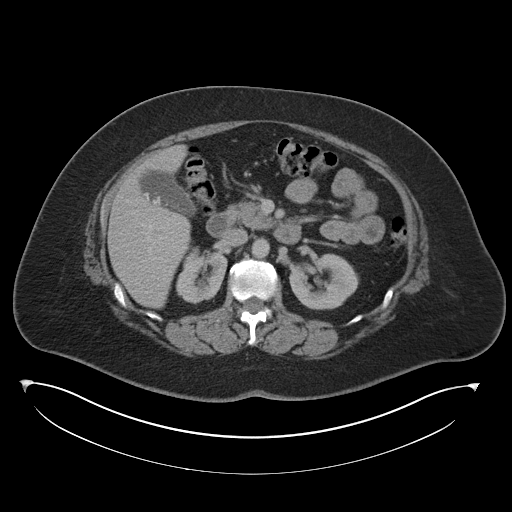
[im 65/101  bone]
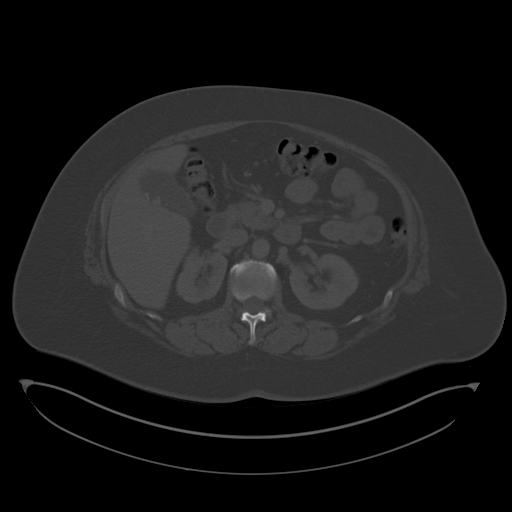
[im 72/101  soft-tissue]
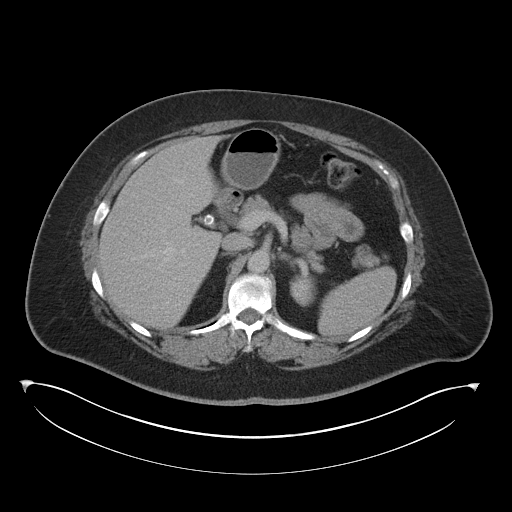
[im 79/101  soft-tissue]
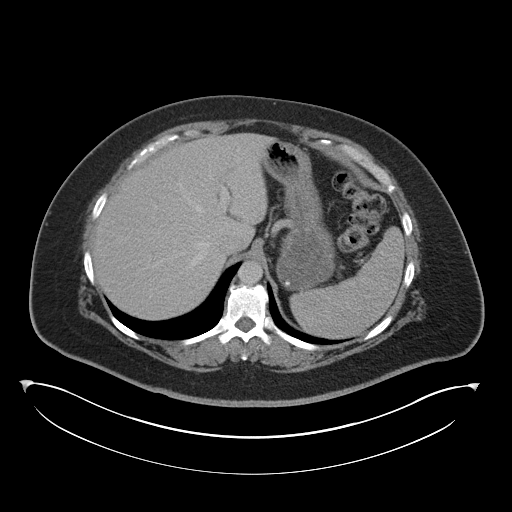
[im 86/101  soft-tissue]
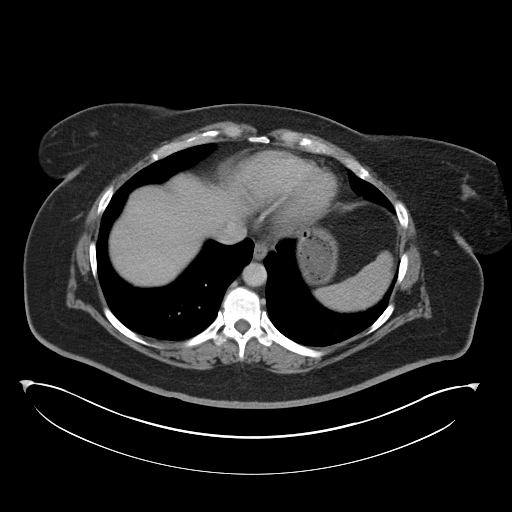
[im 93/101  soft-tissue]
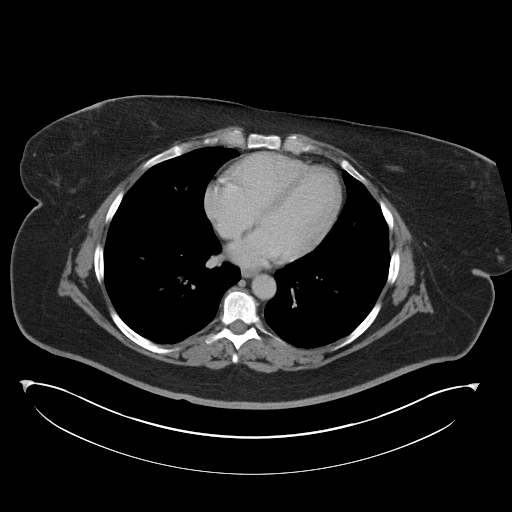

[Series 5: coronal st · coronal · 1.03mm/px · 3 of 175 slices shown]
[im 59/175  soft-tissue]
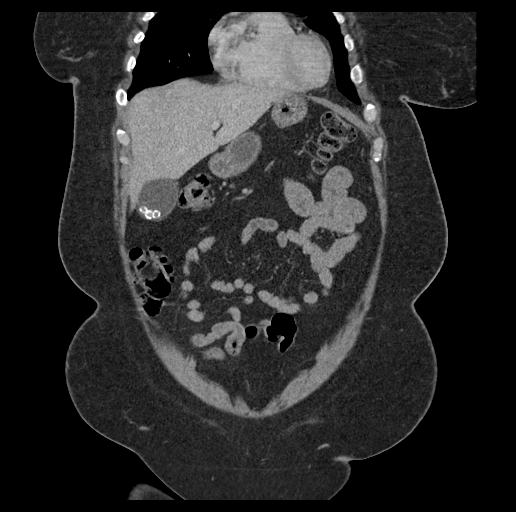
[im 78/175  soft-tissue]
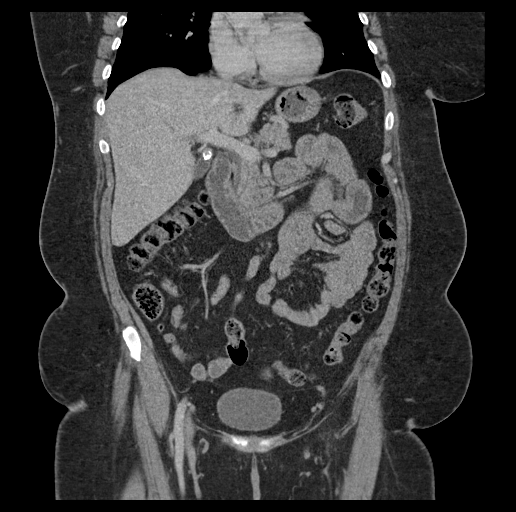
[im 97/175  soft-tissue]
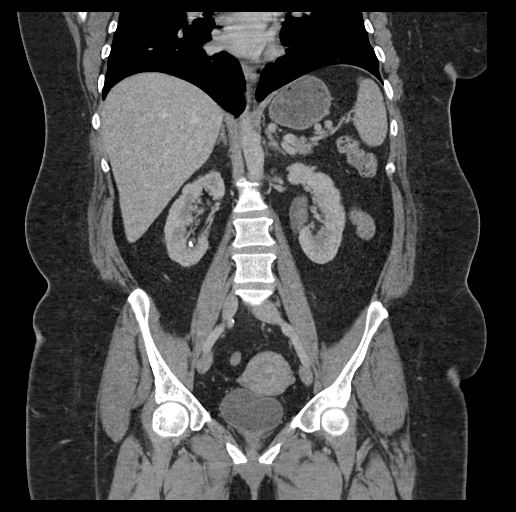

[16 of 46 positions shown; findings below may reference images not displayed]

FINDINGS: Lower chest: No acute abnormality.

Hepatobiliary: Gallbladder is well distended with multiple
gallstones. One is noted in the region of the gallbladder
neck/cystic duct although no complicating factors are seen. The
bladder is within normal limits.

Pancreas: Unremarkable. No pancreatic ductal dilatation or
surrounding inflammatory changes.

Spleen: Normal in size without focal abnormality.

Adrenals/Urinary Tract: Adrenal glands are within normal limits.
Kidneys demonstrate a normal enhancement pattern. Nonobstructing
renal calculus is noted in the lower pole of the right kidney. These
measure approximately 3 mm. Ureter is within normal limits. Bladder
is partially distended. No obstructive changes are seen.

Stomach/Bowel: Appendix is within normal limits. No obstructive or
inflammatory changes of large or small bowel are seen. Stomach is
within normal limits.

Vascular/Lymphatic: No significant vascular findings are present. No
enlarged abdominal or pelvic lymph nodes.

Reproductive: Uterus and bilateral adnexa are unremarkable. Tampon
is noted in the vaginal vault.

Other: No abdominal wall hernia or abnormality. No abdominopelvic
ascites.

Musculoskeletal: No acute or significant osseous findings.
IMPRESSION: Cholelithiasis without complicating factors.

Nonobstructing right renal stone.
